# Patient Record
Sex: Female | Born: 1965 | Race: White | Hispanic: No | Marital: Married | Smoking: Never smoker
Health system: Southern US, Community
[De-identification: ages and names within clinical notes are randomized; demographics above are authoritative.]

## PROBLEM LIST (undated history)

## (undated) DIAGNOSIS — E785 Hyperlipidemia, unspecified: Secondary | ICD-10-CM

## (undated) DIAGNOSIS — E669 Obesity, unspecified: Secondary | ICD-10-CM

## (undated) DIAGNOSIS — I10 Essential (primary) hypertension: Secondary | ICD-10-CM

## (undated) DIAGNOSIS — G932 Benign intracranial hypertension: Secondary | ICD-10-CM

## (undated) DIAGNOSIS — J45909 Unspecified asthma, uncomplicated: Secondary | ICD-10-CM

## (undated) DIAGNOSIS — E119 Type 2 diabetes mellitus without complications: Secondary | ICD-10-CM

## (undated) DIAGNOSIS — E781 Pure hyperglyceridemia: Secondary | ICD-10-CM

## (undated) DIAGNOSIS — I82409 Acute embolism and thrombosis of unspecified deep veins of unspecified lower extremity: Secondary | ICD-10-CM

## (undated) DIAGNOSIS — E039 Hypothyroidism, unspecified: Secondary | ICD-10-CM

## (undated) DIAGNOSIS — I2510 Atherosclerotic heart disease of native coronary artery without angina pectoris: Secondary | ICD-10-CM

## (undated) HISTORY — DX: Hypothyroidism, unspecified: E03.9

## (undated) HISTORY — DX: Type 2 diabetes mellitus without complications: E11.9

## (undated) HISTORY — PX: GALLBLADDER SURGERY: SHX652

## (undated) HISTORY — DX: Essential (primary) hypertension: I10

## (undated) HISTORY — DX: Atherosclerotic heart disease of native coronary artery without angina pectoris: I25.10

## (undated) HISTORY — DX: Unspecified asthma, uncomplicated: J45.909

## (undated) HISTORY — DX: Pure hyperglyceridemia: E78.1

## (undated) HISTORY — DX: Obesity, unspecified: E66.9

## (undated) HISTORY — DX: Acute embolism and thrombosis of unspecified deep veins of unspecified lower extremity: I82.409

## (undated) HISTORY — DX: Benign intracranial hypertension: G93.2

## (undated) HISTORY — PX: OTHER SURGICAL HISTORY: SHX169

## (undated) HISTORY — DX: Hyperlipidemia, unspecified: E78.5

---

## 2006-09-12 ENCOUNTER — Ambulatory Visit (HOSPITAL_COMMUNITY): Admission: RE | Admit: 2006-09-12 | Discharge: 2006-09-12 | Payer: Self-pay | Admitting: *Deleted

## 2008-09-09 ENCOUNTER — Encounter: Payer: Self-pay | Admitting: Cardiovascular Disease

## 2009-01-24 ENCOUNTER — Encounter: Payer: Self-pay | Admitting: Cardiovascular Disease

## 2009-01-24 LAB — CONVERTED CEMR LAB
Free T4: 10.4 ng/dL
T3 Total by RIA: 29.2 nmol/L

## 2009-04-22 ENCOUNTER — Encounter: Payer: Self-pay | Admitting: Cardiovascular Disease

## 2009-04-22 LAB — CONVERTED CEMR LAB
Alkaline Phosphatase: 77 units/L
BUN: 14 mg/dL
Cholesterol: 138 mg/dL
Creatinine, Ser: 0.68 mg/dL
HDL: 39 mg/dL
Total Bilirubin: 0.5 mg/dL
Total Protein: 6.8 g/dL
Triglyceride fasting, serum: 227 mg/dL

## 2009-12-28 ENCOUNTER — Encounter: Payer: Self-pay | Admitting: Cardiovascular Disease

## 2010-01-12 ENCOUNTER — Ambulatory Visit: Payer: Self-pay | Admitting: Cardiovascular Disease

## 2010-01-12 DIAGNOSIS — R0789 Other chest pain: Secondary | ICD-10-CM

## 2010-01-12 DIAGNOSIS — R7309 Other abnormal glucose: Secondary | ICD-10-CM | POA: Insufficient documentation

## 2010-01-12 DIAGNOSIS — E785 Hyperlipidemia, unspecified: Secondary | ICD-10-CM

## 2010-01-12 DIAGNOSIS — E039 Hypothyroidism, unspecified: Secondary | ICD-10-CM | POA: Insufficient documentation

## 2010-01-13 ENCOUNTER — Encounter: Payer: Self-pay | Admitting: Cardiovascular Disease

## 2010-01-13 LAB — CONVERTED CEMR LAB
ALT: 50 units/L
BUN: 12 mg/dL
CO2: 25.9 meq/L
Chloride: 101 meq/L
HDL: 27 mg/dL
TSH: 0.515 microintl units/mL
Total Bilirubin: 0.5 mg/dL
Total Protein: 7.4 g/dL
Triglyceride fasting, serum: 265 mg/dL

## 2010-01-16 ENCOUNTER — Encounter: Payer: Self-pay | Admitting: Cardiovascular Disease

## 2010-01-17 DIAGNOSIS — I2510 Atherosclerotic heart disease of native coronary artery without angina pectoris: Secondary | ICD-10-CM | POA: Insufficient documentation

## 2010-01-17 DIAGNOSIS — Z8672 Personal history of thrombophlebitis: Secondary | ICD-10-CM | POA: Insufficient documentation

## 2010-01-17 DIAGNOSIS — E119 Type 2 diabetes mellitus without complications: Secondary | ICD-10-CM | POA: Insufficient documentation

## 2010-01-17 DIAGNOSIS — R0789 Other chest pain: Secondary | ICD-10-CM | POA: Insufficient documentation

## 2010-01-23 ENCOUNTER — Telehealth: Payer: Self-pay | Admitting: Cardiovascular Disease

## 2010-06-21 ENCOUNTER — Ambulatory Visit: Payer: Self-pay | Admitting: Cardiovascular Disease

## 2010-10-27 ENCOUNTER — Telehealth: Payer: Self-pay | Admitting: Cardiovascular Disease

## 2010-12-30 ENCOUNTER — Encounter: Payer: Self-pay | Admitting: *Deleted

## 2011-01-09 NOTE — Progress Notes (Signed)
Summary: [facility]  [facility]   Imported By: Harlon Flor 01/13/2010 11:22:16  _____________________________________________________________________  External Attachment:    Type:   Image     Comment:   External Document

## 2011-01-09 NOTE — Letter (Signed)
Summary: Medical Record Release  Medical Record Release   Imported By: Harlon Flor 02/09/2010 09:08:09  _____________________________________________________________________  External Attachment:    Type:   Image     Comment:   External Document

## 2011-01-09 NOTE — Assessment & Plan Note (Signed)
Summary: 6 MONTH   Visit Type:  Follow-up Referring Provider:  Mariah Milling Primary Provider:  Dorann Lodge, MD  CC:  No cardiac complaints. Denies chest pain or shortness of breath..  History of Present Illness: 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  She reports that she has had no episodes of tachycardia palpitations on her beta blocker. She feels well. She has been doing the Southeast/Mediterranean diet and has lost over 20 pounds. Her husband is also trying to lose weight. She has been spending most of her summer at the beach and has no complaints of shortness of breath or chest discomfort. She has had relief of her chest discomfort with her ranexa.   Cholesterol is well controlled on no statin medications.  Current Medications (verified): 1)  Bystolic 10 Mg Tabs (Nebivolol Hcl) .Marland Kitchen.. 1 By Mouth Once Daily or As Directed 2)  Synthroid 125 Mcg Tabs (Levothyroxine Sodium) .Marland Kitchen.. 1 By Mouth Once Daily 3)  Ranexa 500 Mg Xr12h-Tab (Ranolazine) .... 2 Tabs By Mouth Twice A Day or As Instructed By Dr. Dian Situ)  Daily Multi  Tabs (Multiple Vitamins-Minerals) .Marland Kitchen.. 1 By Mouth Once Daily 5)  Aspirin 81 Mg Tbec (Aspirin) .... Take One Tablet By Mouth Daily 6)  Metformin Hcl 500 Mg Tabs (Metformin Hcl) .... 2 By Mouth Once Daily 7)  Skelaxin 800 Mg Tabs (Metaxalone) .... As Needed 8)  Fexofenadine Hcl 180 Mg Tabs (Fexofenadine Hcl) .Marland Kitchen.. 1 Once Daily 9)  Furosemide 20 Mg Tabs (Furosemide) .Marland Kitchen.. 1-2 Tablets Once Daily 10)  Vitamin E 400 Unit Caps (Vitamin E) .Marland Kitchen.. 1 Once Daily 11)  B Complex Vitamins  Caps (B Complex Vitamins) .Marland Kitchen.. 1 Once Daily 12)  Sm Omega-3 Fish Oil 1200 Mg Caps (Omega-3 Fatty Acids) .Marland Kitchen.. 1 Once Daily 13)  Vitamin  D3  Allergies (verified): 1)  ! Pcn  Past History:  Past Medical History: Last updated: 01/16/2010 Hyperlipidemia Hypertension Hypothyroidism border line diabetic Left leg DVT Nom critical CAD  Past Surgical History: Last updated: 21-Jan-2010 3 c-sections gall bladder lumbar paritineal shunt ablasion  Family History: Last updated: 01/21/10 Father:deceased 61; MI; HTn Mother: living 13:  Brothers: 37 and 71: healthy, older brother some health problems  Social History: Last updated: Jan 21, 2010 Full Time Married  Tobacco Use - No.  Alcohol Use - no Regular Exercise - no Drug Use - no  Risk Factors: Alcohol Use: 0 (01-21-10) Caffeine Use: 1 cups a day (01/21/2010) Exercise: no (21-Jan-2010)  Risk Factors: Smoking Status: never (January 21, 2010)  Review of Systems  The patient denies fever, weight loss, weight gain, vision loss, decreased hearing, hoarseness, chest pain, syncope, dyspnea on exertion, peripheral edema, prolonged cough, abdominal pain, incontinence, muscle weakness, depression, and enlarged lymph nodes.    Vital Signs:  Patient profile:   45 year old female Height:      61 inches Weight:      242 pounds BMI:     45.89 Pulse rate:   79 / minute BP sitting:   134 / 91  (left arm) Cuff size:   large  Vitals Entered By: Bishop Dublin, CMA (June 21, 2010 10:19 AM)  Physical Exam  General:  Well developed, well nourished, in no acute distress. Head:  normocephalic and atraumatic Neck:  Neck supple, no JVD. No masses, thyromegaly or abnormal cervical nodes. Lungs:  Clear bilaterally to auscultation and percussion. Heart:  Non-displaced PMI, chest non-tender; regular rate and rhythm, S1, S2 without murmurs, rubs or gallops. Carotid upstroke normal, no bruit. Pedals normal pulses. No edema, no varicosities. Abdomen:  Bowel sounds positive; abdomen soft and non-tender without masses. Obese Msk:  Back normal, normal gait. Muscle strength and tone  normal. Pulses:  pulses normal in all 4 extremities Extremities:  No clubbing or cyanosis. Neurologic:  Alert and oriented x 3. Skin:  Intact without lesions or rashes. Psych:  Normal affect.   Impression & Recommendations:  Problem # 1:  CHEST DISCOMFORT (ICD-786.59) no recent episodes of chest discomfort or palpitations on a low-dose beta blocker and Ranexa. We will continue these medications.  Her updated medication list for this problem includes:    Bystolic 10 Mg Tabs (Nebivolol hcl) .Marland Kitchen... 1 by mouth once daily or as directed    Ranexa 500 Mg Xr12h-tab (Ranolazine) .Marland Kitchen... 2 tabs by mouth twice a day or as instructed by dr.    Aspirin 81 Mg Tbec (Aspirin) .Marland Kitchen... Take one tablet by mouth daily  Problem # 2:  OBESITY-MORBID (>100') (ICD-278.01) I'm very excited that her diet is working and she has lost at least 20 pounds. Her encouraged her to increase her walking.  Patient Instructions: 1)  Your physician recommends that you continue on your current medications as directed. Please refer to the Current Medication list given to you today. 2)  Your physician wants you to follow-up in: 1 year.   You will receive a reminder letter in the mail two months in advance. If you don't receive a letter, please call our office to schedule the follow-up appointment. Prescriptions: BYSTOLIC 10 MG TABS (NEBIVOLOL HCL) 1 by mouth once daily or as directed  #28 x 0   Entered by:   Sherri Rad, RN, BSN   Authorized by:   Dossie Arbour MD   Signed by:   Dossie Arbour MD on 06/21/2010   Method used:   Samples Given   RxID:   [id number]

## 2011-01-09 NOTE — Progress Notes (Signed)
Summary: [facility]  [facility]   Imported By: Harlon Flor 01/13/2010 11:21:52  _____________________________________________________________________  External Attachment:    Type:   Image     Comment:   External Document

## 2011-01-09 NOTE — Progress Notes (Signed)
Summary: husband called about labs  Phone Note Call from Patient   Caller: Spouse Summary of Call: pts husband called today to f/u on labs that pt had drawn at [facility].  no results have been received.  pts husband upset that this has not been followed up on- instructed him that information for faxing the labs were included on the orders and that nothing had been rec'd.  contacted [facility] to get labs. will f/u with pt once labs rec'd.  Initial call taken by: Charlena Cross, RN, BSN,  January 23, 2010 3:24 PM

## 2011-01-09 NOTE — Miscellaneous (Signed)
Summary: lab update  Clinical Lists Changes  Observations: Added new observation of ALBUMIN: 3.7 g/dL (16/09/9603 5:40) Added new observation of PROTEIN, TOT: 7.4 g/dL (98/10/9146 8:29) Added new observation of CALCIUM: 9.1 mg/dL (56/21/3086 5:78) Added new observation of ALK PHOS: 91 units/L (01/13/2010 8:48) Added new observation of BILI TOTAL: 0.5 mg/dL (46/96/2952 8:41) Added new observation of SGPT (ALT): 50 units/L (01/13/2010 8:48) Added new observation of SGOT (AST): 27 units/L (01/13/2010 8:48) Added new observation of CO2 PLSM/SER: 25.9 meq/L (01/13/2010 8:48) Added new observation of CL SERUM: 101 meq/L (01/13/2010 8:48) Added new observation of K SERUM: 3.9 meq/L (01/13/2010 8:48) Added new observation of NA: 138 meq/L (01/13/2010 8:48) Added new observation of CREATININE: 0.7 mg/dL (32/44/0102 7:25) Added new observation of BUN: 12 mg/dL (36/64/4034 7:42) Added new observation of BG RANDOM: 109 mg/dL (59/56/3875 6:43) Added new observation of TSH: .515 microintl units/mL (01/13/2010 8:47) Added new observation of TRIGLYCERIDE: 265 mg/dL (32/95/1884 1:66) Added new observation of HDL: 27 mg/dL (06/08/1600 0:93) Added new observation of LDL: 69 mg/dL (23/55/7322 0:25) Added new observation of CHOLESTEROL: 149 mg/dL (42/70/6237 6:28)      -  Date:  01/13/2010    Cholesterol: 149    LDL: 69    HDL: 27    Triglycerides: 315    TSH: .515    BG Random: 109    BUN: 12    Creatinine: 0.7    Sodium: 138    Potassium: 3.9    Chloride: 101    CO2 Total: 25.9    SGOT (AST): 27    SGPT (ALT): 50    T. Bilirubin: 0.5    Alk Phos: 91    Calcium: 9.1    Total Protein: 7.4    Albumin: 3.7

## 2011-01-09 NOTE — Progress Notes (Signed)
Summary: Surgical Clearance  Phone Note Call from Patient Call back at Home Phone [phone]   Caller: Husband Call For: [facility] Summary of Call: Needs surgical clearance note for tonsilectomy-Dr. Juel Burrow phone #[phone] Initial call taken by: Harlon Flor,  October 27, 2010 11:20 AM  Follow-up for Phone Call        No cardiac testing needed. She can proceed with the surgery. Follow-up by: Dr Julien Nordmann, October 29, 2010  Additional Follow-up for Phone Call Additional follow up Details #1::        Called office, faxing phone note with clearance to [phone], OK per Dr Mariah Milling to proceed with surgery, no further cardiac testing needed.  Additional Follow-up by: Cloyde Reams RN,  October 30, 2010 8:32 AM

## 2011-01-09 NOTE — Progress Notes (Signed)
Summary: PHI  PHI   Imported By: Harlon Flor 01/13/2010 11:22:31  _____________________________________________________________________  External Attachment:    Type:   Image     Comment:   External Document

## 2011-01-09 NOTE — Assessment & Plan Note (Signed)
Summary: NP6/AMD   Visit Type:  New Patient Referring Provider:  Mariah Bauer Primary Provider:  Dorann Lodge, MD  CC:  no complaints.  History of Present Illness: 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  Ms. Palen states that overall she has been doing well. She continues to battle with her obesity and her weight is up 6 pounds or more from her last visit. She has had episodes of tachycardia palpitations and has had to take an extra half dose of her bystolic for a total of 10 mg. Typically she takes 5 mg a day. she continues to have mild left lower stomach swelling secondary to her history of DVT. She is not working out at [employer] though previously she used to work out at [employer]. She continues to get some episodes of chest discomfort particularly if she does not take her ranexa.  Preventive Screening-Counseling & Management  Alcohol-Tobacco     Alcohol drinks/day: 0     Smoking Status: never  Caffeine-Diet-Exercise     Caffeine use/day: 1 cups a day     Does Patient Exercise: no      Drug Use:  no.    Current Problems (verified): 1)  Chest Pain, Atypical, Hx of  (ICD-V15.89)  Current Medications (verified): 1)  Bystolic 10 Mg Tabs (Nebivolol Hcl) .Marland Kitchen.. 1 By Mouth Once Daily or As Directed 2)  Synthroid 125 Mcg Tabs (Levothyroxine Sodium) .Marland Kitchen.. 1 By Mouth Once Daily 3)  Renexa 500 .Marland Kitchen.. 1 By Mouth Two Times A Day or As Directed 4)  Daily Multi  Tabs (Multiple Vitamins-Minerals) .Marland Kitchen.. 1 By Mouth Once Daily 5)  Aspirin 81 Mg Tbec (Aspirin) .... Take One Tablet By Mouth Daily 6)  Metformin Hcl 500 Mg Tabs (Metformin Hcl) .... 2 By Mouth Once Daily  Allergies (verified): 1)  ! Pcn  Past History:  Past Medical  History: Hyperlipidemia Hypertension Hypothyroidism border line diabetic  Past Surgical History: 3 c-sections gall bladder lumbar paritineal shunt ablasion  Family History: Father:deceased 20; MI; HTn Mother: living 1:  Brothers: 37 and 44: healthy, older brother some health problems  Social History: Full Time Married  Tobacco Use - No.  Alcohol Use - no Regular Exercise - no Drug Use - no Alcohol drinks/day:  0 Smoking Status:  never Caffeine use/day:  1 cups a day Does Patient Exercise:  no Drug Use:  no  Review of Systems       The patient complains of chest pain.  The patient denies anorexia, fever, weight loss, weight gain, vision loss, decreased hearing, hoarseness, syncope, dyspnea on exertion, peripheral edema, prolonged cough, headaches, hemoptysis, abdominal pain, melena, hematochezia, severe indigestion/heartburn, hematuria, incontinence, genital sores, muscle weakness, suspicious skin lesions, transient blindness, difficulty walking, depression, unusual weight change, abnormal bleeding, enlarged lymph nodes, angioedema, breast masses, and testicular masses.         + palpitations, tachycardia  Vital Signs:  Patient profile:   45 year old female Height:      61 inches Weight:      268 pounds BMI:     50.82 Pulse rate:   70 / minute Pulse rhythm:   regular BP sitting:   118 / 70  (right arm) Cuff size:   large  Vitals Entered By: Mercer Pod (January 12, 2010 10:36 AM)  Physical Exam  General:  no apparent distress, obese woman with normal respirations. H. ENT exam is benign, oropharynx is clear, neck is supple with no JVP or carotid bruits. Heart sounds are regular with normal S1 and S2 with no murmurs appreciated, lungs are clear to auscultation with no wheezes Rales, normal exam is notable for severe obesity though otherwise benign. No significant lower extremity edema, pulses are equal and symmetrical in her upper and lower extremities.  Neurological exam is grossly nonfocal. Skin is warm and dry.   New Orders:     1)  T-Comprehensive Metabolic Panel [id number])     2)  T-Lipid Profile ([id number])     3)  T-TSH ([id number])     4)  T-Hgb A1C ([id number])   Impression & Recommendations:  Problem # 1:  CHEST PAIN, ATYPICAL, HX OF (ICD-V15.89) cardiac catheter in 2007 that showed noncritical CAD. She has been maintained on her next a with significantly  improved episodes of chest pain.we will continue her on this medication at 500 mg p.o. b.i.d.  Problem # 2:  HYPERLIPIDEMIA (ICD-272.4) we have given her a lab slip to have her cholesterol checked in the next week and will followup with her on the results. As she is diabetic or prediabetic, her LDL should be 100 or less. She is currently not on a cholesterol medication. Orders: T-Comprehensive Metabolic Panel [id number]) T-Lipid Profile ([id number])  Problem # 3:  PRE-DIABETES (ICD-790.29) we will check a hemoglobin A1c with her lab check this week. We have strongly encouraged her to lose weight and she is well above her ideal body weight. She and her husband state that they are going to be joined a gym and start working out. She's currently on metformin 1000 mg at night Orders: T-Hgb A1C ([id number])  Problem # 4:  OBESITY-MORBID (>100') (ICD-278.01) recommended to Decrease her p.o. intake, increase her exercise in an effort to lose weight. We did not address with her that a gastric bypass surgery or LAP-BAND surgery might be an option we can address this with her later.  Other Orders: T-TSH [id number])  Patient Instructions: 1)  Your physician recommends that you schedule a follow-up appointment in: 6 MONTHS 2)  Your physician recommends that you return for a FASTING lipid profile: at [facility] (lipids, cmet, a1c, tsh) 3)  Your physician recommends that you continue on your current medications as directed. Please refer to the Current Medication  list given to you today. Prescriptions: RANEXA 500 MG XR12H-TAB (RANOLAZINE) 2 tabs by mouth twice a day or as instructed by Dr.  Lawana Chambers x 6   Entered by:   Charlena Cross, RN, BSN   Authorized by:   Dossie Arbour MD   Signed by:   Charlena Cross, RN, BSN on 01/12/2010   Method used:   Electronically to        [facility]* (retail)       [address]       [place], [place]  [address]       Ph: [phone]       Fax: [phone]   RxID:   [id number] BYSTOLIC 10 MG TABS (NEBIVOLOL HCL) 1 by mouth once daily or as directed  #30 x 6   Entered by:   Charlena Cross, RN, BSN   Authorized by:   Dossie Arbour MD   Signed by:   Charlena Cross, RN, BSN on 01/12/2010  Method used:   Electronically to        [facility]* (retail)       [address]       [place], [place]  [address]       Ph: [phone]       Fax: [phone]   RxID:   [id number]

## 2011-06-27 ENCOUNTER — Encounter: Payer: Self-pay | Admitting: Cardiovascular Disease

## 2011-07-12 ENCOUNTER — Encounter: Payer: Self-pay | Admitting: Cardiovascular Disease

## 2011-07-12 ENCOUNTER — Ambulatory Visit (INDEPENDENT_AMBULATORY_CARE_PROVIDER_SITE_OTHER): Payer: BC Managed Care – PPO | Admitting: Cardiovascular Disease

## 2011-07-12 DIAGNOSIS — R609 Edema, unspecified: Secondary | ICD-10-CM | POA: Insufficient documentation

## 2011-07-12 DIAGNOSIS — Z8672 Personal history of thrombophlebitis: Secondary | ICD-10-CM

## 2011-07-12 DIAGNOSIS — Z9189 Other specified personal risk factors, not elsewhere classified: Secondary | ICD-10-CM

## 2011-07-12 DIAGNOSIS — E119 Type 2 diabetes mellitus without complications: Secondary | ICD-10-CM

## 2011-07-12 DIAGNOSIS — E785 Hyperlipidemia, unspecified: Secondary | ICD-10-CM

## 2011-07-12 DIAGNOSIS — I2510 Atherosclerotic heart disease of native coronary artery without angina pectoris: Secondary | ICD-10-CM

## 2011-07-12 MED ORDER — NEBIVOLOL HCL 10 MG PO TABS: 10.0000 mg | ORAL_TABLET | Freq: Every day | ORAL | Status: DC

## 2011-07-12 MED ORDER — RANOLAZINE ER 500 MG PO TB12: 1000.0000 mg | ORAL_TABLET | Freq: Two times a day (BID) | ORAL | Status: DC

## 2011-07-12 NOTE — Assessment & Plan Note (Signed)
We did not discuss her weight with her as her husband and son were in the room. We'll need to discuss this with her on her next visit.

## 2011-07-12 NOTE — Assessment & Plan Note (Signed)
She is at risk of further DVTs. We have instructed her on the leg lifts and hip flexing movement while she sits at her desk. She has trace bilateral edema and we have suggested leg elevation and TED hose. She can also try Ace wraps at the end of the day.

## 2011-07-12 NOTE — Patient Instructions (Addendum)
You are doing well. No medication changes were made. Try leg elevation, ace wrap, increase diuretic for leg swelling. Please call us if you have new issues that need to be addressed before your next appt.  We will call you for a follow up Appt. In 12 months

## 2011-07-12 NOTE — Assessment & Plan Note (Signed)
Cholesterol in the past has been excellent. She Would like to come off her fish oil for now as it is contributing to GI upset. We have suggested she try flaxseed oil for elevated triglycerides.

## 2011-07-12 NOTE — Assessment & Plan Note (Signed)
Currently with no symptoms of angina. No further workup at this time. Continue current medication regimen. 

## 2011-07-12 NOTE — Assessment & Plan Note (Signed)
We have encouraged continued exercise, careful diet management in an effort to lose weight. 

## 2011-07-12 NOTE — Assessment & Plan Note (Signed)
Bilateral edema that is trace pitting. We have suggested she take her Lasix on a regular basis. Her edema certainly could be secondary to venous insufficiency though she should try Lasix with fluid restriction and low salt intake. Given her weight, she will be prone to fluid retention and possibly sleep apnea and diastolic dysfunction.

## 2011-07-12 NOTE — Progress Notes (Signed)
Patient ID: Emma Bauer, female    DOB: 01-08-1966, 45 y.o.   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   She reports that she has been having worsening edema of both legs over the past several weeks. She has been spending long periods at her desk as well as standing. She has not been taking her Lasix on a regular basis. She does drink a lot of fluids.   She also reports some tightness in her chest. She did have a very stressful period when her child was sick with Salmonella. She increased her ranexa to 1000 mg b.i.d. And also increase her bystolic to 10 mg. This seemed to make her symptoms mildly better though she continues to have very mild symptoms. She does have some heartburn type symptoms as well and complains about the fish oil and a fishy taste.  Her weight has been an issue and continues to trend upwards. EKG today shows normal sinus rhythm with rate 74 beats per minute with no significant ST or T wave changes. Left axis deviation    Outpatient Encounter Prescriptions as of 07/12/2011  Medication Sig Dispense Refill  . aspirin 81 MG EC tablet Take 81 mg by mouth daily.        Marland Kitchen b complex vitamins capsule Take 1 capsule by mouth daily.        . fexofenadine (ALLEGRA) 180 MG tablet Take 180 mg by mouth daily.        . furosemide (LASIX) 20 MG tablet Take 20-40 mg by mouth daily.       Marland Kitchen levothyroxine (SYNTHROID, LEVOTHROID) 125 MCG tablet Take 125 mcg by mouth daily.        . metaxalone (SKELAXIN) 800 MG tablet Take 800 mg by mouth as needed.        . metFORMIN (GLUCOPHAGE) 500 MG tablet Take 1,000 mg by mouth daily.        . Multiple Vitamin (MULTIVITAMIN) tablet Take 1 tablet  by mouth daily.        . nebivolol (BYSTOLIC) 10 MG tablet Take 1 tablet (10 mg total) by mouth daily.  30 tablet  11  . Omega-3 Fatty Acids (SM OMEGA-3 FISH OIL) 1200 MG CAPS Take 1 capsule by mouth daily.        . potassium chloride (KLOR-CON) 10 MEQ CR tablet Take 10 mEq by mouth daily.        . ranolazine (RANEXA) 500 MG 12 hr tablet Take 2 tablets (1,000 mg total) by mouth 2 (two) times daily. Take two tablets twice a day.  120 tablet  11  . vitamin E 400 UNIT capsule Take 400 Units by mouth daily.           Review of Systems  Constitutional: Negative.   HENT: Negative.   Eyes: Negative.   Respiratory: Negative.   Cardiovascular: Negative.   Gastrointestinal: Negative.   Musculoskeletal: Negative.   Skin: Negative.   Neurological: Negative.   Hematological: Negative.   Psychiatric/Behavioral: Negative.   All other systems reviewed and are negative.    BP 120/80  Pulse 74  Ht 5\' 1"  (1.549 m)  Wt 266 lb (120.657 kg)  BMI 50.26 kg/m2  Physical  Exam  Nursing note and vitals reviewed. Constitutional: She is oriented to person, place, and time. She appears well-developed and well-nourished.       Morbidly obese  HENT:  Head: Normocephalic.  Nose: Nose normal.  Mouth/Throat: Oropharynx is clear and moist.  Eyes: Conjunctivae are normal. Pupils are equal, round, and reactive to light.  Neck: Normal range of motion. Neck supple. No JVD present.  Cardiovascular: Normal rate, regular rhythm, S1 normal, S2 normal, normal heart sounds and intact distal pulses.  Exam reveals no gallop and no friction rub.   No murmur heard.      Trace pitting edema to the mid shins bilateral  Pulmonary/Chest: Effort normal and breath sounds normal. No respiratory distress. She has no wheezes. She has no rales. She exhibits no tenderness.  Abdominal: Soft. Bowel sounds are normal. She exhibits no distension. There is no tenderness.  Musculoskeletal: Normal range of motion. She exhibits edema. She  exhibits no tenderness.  Lymphadenopathy:    She has no cervical adenopathy.  Neurological: She is alert and oriented to person, place, and time. Coordination normal.  Skin: Skin is warm and dry. No rash noted. No erythema.  Psychiatric: She has a normal mood and affect. Her behavior is normal. Judgment and thought content normal.         Assessment and Plan

## 2011-07-12 NOTE — Assessment & Plan Note (Signed)
She does have an atypical type chest sensation in the middle of her mediastinum. Possibly secondary to stress or GERD. No further workup at this time. We have suggested she increase her proton pump inhibitor. Take BID

## 2012-07-28 ENCOUNTER — Other Ambulatory Visit: Payer: Self-pay | Admitting: Cardiovascular Disease

## 2012-07-28 NOTE — Telephone Encounter (Signed)
Refilled Ranexa and scheduled 1 yr f/u appointment.

## 2012-08-07 ENCOUNTER — Other Ambulatory Visit: Payer: Self-pay | Admitting: Cardiovascular Disease

## 2012-08-07 NOTE — Telephone Encounter (Signed)
Refilled Bystolic

## 2012-08-19 ENCOUNTER — Ambulatory Visit (INDEPENDENT_AMBULATORY_CARE_PROVIDER_SITE_OTHER): Payer: BC Managed Care – PPO | Admitting: Cardiovascular Disease

## 2012-08-19 ENCOUNTER — Encounter: Payer: Self-pay | Admitting: Cardiovascular Disease

## 2012-08-19 VITALS — BP 128/70 | HR 74 | Ht 61.0 in | Wt 266.5 lb

## 2012-08-19 DIAGNOSIS — E785 Hyperlipidemia, unspecified: Secondary | ICD-10-CM

## 2012-08-19 DIAGNOSIS — R609 Edema, unspecified: Secondary | ICD-10-CM

## 2012-08-19 DIAGNOSIS — I2510 Atherosclerotic heart disease of native coronary artery without angina pectoris: Secondary | ICD-10-CM

## 2012-08-19 DIAGNOSIS — R002 Palpitations: Secondary | ICD-10-CM

## 2012-08-19 DIAGNOSIS — R000 Tachycardia, unspecified: Secondary | ICD-10-CM | POA: Insufficient documentation

## 2012-08-19 NOTE — Assessment & Plan Note (Signed)
Etiology of her tachycardia is uncertain. It has improved with higher dose ranexa at night and did not respond to higher dose beta blocker. Symptoms have resolved. I suggested if she starts to have symptoms again, we could do a Holter monitor.

## 2012-08-19 NOTE — Assessment & Plan Note (Signed)
Trace edema. We have suggested she continue on diuretic daily

## 2012-08-19 NOTE — Patient Instructions (Addendum)
You are doing well. No medication changes were made.  Please call us if you have new issues that need to be addressed before your next appt.  Your physician wants you to follow-up in: 6 months.  You will receive a reminder letter in the mail two months in advance. If you don't receive a letter, please call our office to schedule the follow-up appointment.   

## 2012-08-19 NOTE — Assessment & Plan Note (Signed)
We have encouraged continued exercise, careful diet management in an effort to lose weight. 

## 2012-08-19 NOTE — Progress Notes (Signed)
Patient ID: Emma Bauer, female    DOB: 08-Feb-1966, 46 y.o.   MRN: [id number]  HPI Comments: Ms. 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  She reports that her edema has been stable with a diuretic. She has had recent episodes of tachycardia at nighttime that wake her from sleep at 1 in the morning sometimes later lasting for at least 2 hours. She tried extra beta blocker but this did not help her symptoms. She took extra ranexa at nighttime and this seems to have helped her symptoms. She's not had a tachycardia for at least 2 weeks. She was having headaches in the daytime that got worse into the night. She took Zomig periodically and this seemed to help.   No significant chest pain or shortness of breath. Her weight has been an issue and continues to trend upwards.  EKG today shows normal sinus rhythm with rate 74 beats per minute with no significant ST or T wave changes. Left axis deviation    Outpatient Encounter Prescriptions as of 08/19/2012  Medication Sig Dispense Refill  . aspirin 81 MG EC tablet Take 81 mg by mouth daily.        Marland Kitchen b complex vitamins capsule Take 1 capsule by mouth daily.        Marland Kitchen BYSTOLIC 10 MG tablet TAKE 1 TABLET BY MOUTH EVERY DAY  30 tablet  10  . citalopram (CELEXA) 10 MG tablet Take 10 mg by mouth daily.      . fexofenadine (ALLEGRA) 180 MG tablet Take 180 mg by mouth daily.        . furosemide (LASIX) 20 MG tablet Take 20-40 mg by mouth daily.       Marland Kitchen levothyroxine (SYNTHROID, LEVOTHROID) 112 MCG tablet Take 112 mcg by mouth daily.      . metaxalone (SKELAXIN) 800 MG tablet Take 800 mg by mouth as needed.        . metFORMIN (GLUCOPHAGE) 500 MG tablet Take 1,000 mg by mouth daily.         . Multiple Vitamin (MULTIVITAMIN) tablet Take 1 tablet by mouth daily.        Marland Kitchen omeprazole (PRILOSEC) 40 MG capsule Take 40 mg by mouth daily.      Marland Kitchen RANEXA 500 MG 12 hr tablet TAKE 2 TABLETS BY MOUTH TWICE DAILY  120 tablet  5  . saxagliptin HCl (ONGLYZA) 5 MG TABS tablet Take 5 mg by mouth daily.        Review of Systems  Constitutional: Negative.   HENT: Negative.   Eyes: Negative.   Respiratory: Negative.   Cardiovascular: Positive for palpitations.       Tachycardia  Gastrointestinal: Negative.   Musculoskeletal: Negative.   Skin: Negative.   Neurological: Negative.   Hematological: Negative.   Psychiatric/Behavioral: Negative.   All other systems reviewed and are negative.    BP 128/70  Pulse 74  Ht 5\' 1"  (1.549 m)  Wt 266 lb 8 oz (120.884 kg)  BMI 50.35 kg/m2  Physical Exam  Nursing note and vitals reviewed. Constitutional: She is oriented to person, place, and time. She appears well-developed and well-nourished.       Morbidly obese  HENT:  Head: Normocephalic.  Nose: Nose  normal.  Mouth/Throat: Oropharynx is clear and moist.  Eyes: Conjunctivae are normal. Pupils are equal, round, and reactive to light.  Neck: Normal range of motion. Neck supple. No JVD present.  Cardiovascular: Normal rate, regular rhythm, S1 normal, S2 normal, normal heart sounds and intact distal pulses.  Exam reveals no gallop and no friction rub.   No murmur heard.      Trace pitting edema to the mid shins bilateral  Pulmonary/Chest: Effort normal and breath sounds normal. No respiratory distress. She has no wheezes. She has no rales. She exhibits no tenderness.  Abdominal: Soft. Bowel sounds are normal. She exhibits no distension. There is no tenderness.  Musculoskeletal: Normal range of motion. She exhibits no edema and no tenderness.  Lymphadenopathy:    She has no cervical adenopathy.  Neurological: She is alert and oriented to person, place, and time. Coordination normal.  Skin:  Skin is warm and dry. No rash noted. No erythema.  Psychiatric: She has a normal mood and affect. Her behavior is normal. Judgment and thought content normal.         Assessment and Plan

## 2012-08-19 NOTE — Assessment & Plan Note (Signed)
Currently with no symptoms of angina. No further workup at this time. Continue current medication regimen. 

## 2012-08-19 NOTE — Assessment & Plan Note (Signed)
We have suggested she send lab work from Dr. Leonor Liv when it is done later this year for our review

## 2013-03-09 ENCOUNTER — Ambulatory Visit (INDEPENDENT_AMBULATORY_CARE_PROVIDER_SITE_OTHER): Payer: Managed Care, Other (non HMO) | Admitting: Cardiovascular Disease

## 2013-03-09 ENCOUNTER — Encounter: Payer: Self-pay | Admitting: Cardiovascular Disease

## 2013-03-09 VITALS — BP 142/82 | HR 82 | Ht 60.0 in | Wt 269.0 lb

## 2013-03-09 DIAGNOSIS — E119 Type 2 diabetes mellitus without complications: Secondary | ICD-10-CM

## 2013-03-09 DIAGNOSIS — Z9189 Other specified personal risk factors, not elsewhere classified: Secondary | ICD-10-CM

## 2013-03-09 DIAGNOSIS — R000 Tachycardia, unspecified: Secondary | ICD-10-CM

## 2013-03-09 DIAGNOSIS — I2510 Atherosclerotic heart disease of native coronary artery without angina pectoris: Secondary | ICD-10-CM

## 2013-03-09 DIAGNOSIS — E785 Hyperlipidemia, unspecified: Secondary | ICD-10-CM

## 2013-03-09 NOTE — Assessment & Plan Note (Signed)
Tachycardia is not a significant issue on today's visit. No further workup at this time

## 2013-03-09 NOTE — Assessment & Plan Note (Signed)
Encouraged weight loss, diet and exercise

## 2013-03-09 NOTE — Patient Instructions (Addendum)
You are doing well. No medication changes were made.  Please call us if you have new issues that need to be addressed before your next appt.  Your physician wants you to follow-up in: 12 months.  You will receive a reminder letter in the mail two months in advance. If you don't receive a letter, please call our office to schedule the follow-up appointment. 

## 2013-03-09 NOTE — Progress Notes (Signed)
Patient ID: Emma Bauer, female    DOB: Oct 12, 1966, 47 y.o.   MRN: [id number]  HPI Comments: 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  She reports that overall she has been doing well. She was diagnosed with reactive airway disease. She has been on a prednisone taper, started on Singulair. 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 Other allergens such as smoke and perfumes will cause her to be more short of breath with wheezing. No significant problems with palpitations or tachycardia. No recent chest pain. Edema has been stable. No significant chest pain or shortness of breath. Her weight has been stable.  EKG today shows normal sinus rhythm with rate 82 beats per minute with no significant ST or T wave changes. Left axis deviation    Outpatient Encounter Prescriptions as of 03/09/2013  Medication Sig Dispense Refill  . aspirin 81 MG EC tablet Take 81 mg by mouth daily.        Marland Kitchen b complex vitamins capsule Take 1 capsule by mouth daily.        Marland Kitchen buPROPion (WELLBUTRIN XL) 150 MG 24 hr tablet Take 150 mg by mouth daily.       Marland Kitchen BYSTOLIC 10 MG tablet TAKE 1 TABLET BY MOUTH EVERY DAY  30 tablet  10  . fexofenadine (ALLEGRA) 180 MG tablet Take 180 mg by mouth daily.        . furosemide (LASIX) 20 MG tablet Take 20-40 mg by mouth daily as needed.       Marland Kitchen levothyroxine (SYNTHROID, LEVOTHROID) 100 MCG tablet Take 100 mcg by mouth daily.      . metaxalone (SKELAXIN) 800 MG tablet Take 800 mg by mouth as needed.        . metFORMIN (GLUCOPHAGE) 500 MG tablet Take 1,000 mg by mouth daily.        . montelukast (SINGULAIR) 10 MG tablet Take 10 mg by mouth at bedtime.      . moxifloxacin (AVELOX) 400 MG tablet Take 400 mg by  mouth daily.      . Multiple Vitamin (MULTIVITAMIN) tablet Take 1 tablet by mouth daily.        Marland Kitchen omeprazole (PRILOSEC) 40 MG capsule Take 40 mg by mouth daily.      . predniSONE (STERAPRED UNI-PAK) 10 MG tablet Take by mouth daily.       Marland Kitchen RANEXA 500 MG 12 hr tablet TAKE 2 TABLETS BY MOUTH TWICE DAILY  120 tablet  5  . saxagliptin HCl (ONGLYZA) 5 MG TABS tablet Take 5 mg by mouth daily.         Review of Systems  Constitutional: Negative.   HENT: Negative.   Eyes: Negative.   Respiratory: Negative.   Cardiovascular:       Tachycardia  Gastrointestinal: Negative.   Musculoskeletal: Negative.   Skin: Negative.   Neurological: Negative.   Psychiatric/Behavioral: Negative.   All other systems reviewed and are negative.    BP 142/82  Pulse 82  Ht 5' (1.524 m)  Wt 269 lb (122.018 kg)  BMI 52.54 kg/m2  Physical Exam  Nursing note and vitals reviewed. Constitutional: She is oriented to person, place, and time. She appears well-developed and well-nourished.  Morbidly obese  HENT:  Head: Normocephalic.  Nose: Nose normal.  Mouth/Throat: Oropharynx is clear and moist.  Eyes: Conjunctivae are normal. Pupils are equal, round, and reactive to light.  Neck: Normal range of motion. Neck supple. No JVD present.  Cardiovascular: Normal rate, regular rhythm, S1 normal, S2 normal, normal heart sounds and intact distal pulses.  Exam reveals no gallop and no friction rub.   No murmur heard. Trace pitting edema to the mid shins bilateral  Pulmonary/Chest: Effort normal and breath sounds normal. No respiratory distress. She has no wheezes. She has no rales. She exhibits no tenderness.  Abdominal: Soft. Bowel sounds are normal. She exhibits no distension. There is no tenderness.  Musculoskeletal: Normal range of motion. She exhibits no edema and no tenderness.  Lymphadenopathy:    She has no cervical adenopathy.  Neurological: She is alert and oriented to person, place, and time.  Coordination normal.  Skin: Skin is warm and dry. No rash noted. No erythema.  Psychiatric: She has a normal mood and affect. Her behavior is normal. Judgment and thought content normal.    Assessment and Plan

## 2013-03-09 NOTE — Assessment & Plan Note (Signed)
We have encouraged continued exercise, careful diet management in an effort to lose weight. 

## 2013-03-09 NOTE — Assessment & Plan Note (Signed)
Currently with no symptoms of angina. No further workup at this time. Continue current medication regimen. 

## 2013-03-09 NOTE — Assessment & Plan Note (Signed)
No recent episodes of chest pain

## 2013-08-17 ENCOUNTER — Other Ambulatory Visit: Payer: Self-pay | Admitting: Cardiovascular Disease

## 2013-08-17 NOTE — Telephone Encounter (Signed)
Refilled Bystolic sent to [facility].

## 2013-08-24 ENCOUNTER — Other Ambulatory Visit: Payer: Self-pay | Admitting: Cardiovascular Disease

## 2013-08-24 NOTE — Telephone Encounter (Signed)
Refilled Ranexa sent to [facility] pharmacy.

## 2014-02-22 ENCOUNTER — Encounter: Payer: Self-pay | Admitting: Cardiovascular Disease

## 2014-02-22 ENCOUNTER — Ambulatory Visit (INDEPENDENT_AMBULATORY_CARE_PROVIDER_SITE_OTHER): Payer: Managed Care, Other (non HMO) | Admitting: Cardiovascular Disease

## 2014-02-22 VITALS — BP 134/80 | HR 92 | Ht 60.0 in | Wt 282.2 lb

## 2014-02-22 DIAGNOSIS — I2510 Atherosclerotic heart disease of native coronary artery without angina pectoris: Secondary | ICD-10-CM

## 2014-02-22 DIAGNOSIS — E119 Type 2 diabetes mellitus without complications: Secondary | ICD-10-CM

## 2014-02-22 DIAGNOSIS — R7309 Other abnormal glucose: Secondary | ICD-10-CM

## 2014-02-22 DIAGNOSIS — R609 Edema, unspecified: Secondary | ICD-10-CM

## 2014-02-22 DIAGNOSIS — R000 Tachycardia, unspecified: Secondary | ICD-10-CM

## 2014-02-22 DIAGNOSIS — E785 Hyperlipidemia, unspecified: Secondary | ICD-10-CM

## 2014-02-22 MED ORDER — FUROSEMIDE 20 MG PO TABS: 20.0000 mg | ORAL_TABLET | Freq: Two times a day (BID) | ORAL | Status: DC | PRN

## 2014-02-22 MED ORDER — RANOLAZINE ER 1000 MG PO TB12
1000.0000 mg | ORAL_TABLET | Freq: Two times a day (BID) | ORAL | Status: DC
Start: 2014-02-22 — End: 2015-08-05

## 2014-02-22 NOTE — Assessment & Plan Note (Signed)
She denies any recent symptoms concerning for tachycardia

## 2014-02-22 NOTE — Progress Notes (Signed)
Patient ID: Emma Bauer, female    DOB: 03-14-1966, 48 y.o.   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  She reports that overall she has been doing well. Previously diagnosed with  reactive airway disease. Last year required a prednisone taper, started on Singulair. 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 Other allergens such as smoke and perfumes will cause her to be more short of breath with wheezing. No significant problems with palpitations or tachycardia. No recent chest pain. Edema has been worse recently over the past week. Uncertain if diet has changed No significant chest pain or shortness of breath. Her weight has been stable, continues to be elevated Not participating in a regular exercise program  EKG today shows normal sinus rhythm with rate 92 beats per minute with no significant ST or T wave changes.     Outpatient Encounter Prescriptions as of 02/22/2014  Medication Sig  . aspirin 81 MG EC tablet Take 81 mg by mouth daily.    Marland Kitchen b complex vitamins capsule Take 1 capsule by mouth daily.    Marland Kitchen BYSTOLIC 10 MG tablet TAKE 1 TABLET BY MOUTH EVERY DAY  . fexofenadine (ALLEGRA) 180 MG tablet Take 180 mg by mouth daily.    . furosemide (LASIX) 20 MG tablet Take 20-40 mg by mouth daily as needed.   Marland Kitchen levothyroxine (SYNTHROID, LEVOTHROID) 100 MCG tablet Take 100 mcg by mouth daily.  . metaxalone (SKELAXIN) 800 MG tablet Take 800 mg by mouth as needed.    . metFORMIN (GLUCOPHAGE) 500 MG tablet Take 1,000 mg by mouth daily.    . mometasone-formoterol (DULERA) 200-5 MCG/ACT AERO Inhale 2 puffs into the lungs daily.  . montelukast (SINGULAIR) 10 MG tablet Take 10 mg by mouth  at bedtime.  . Multiple Vitamin (MULTIVITAMIN) tablet Take 1 tablet by mouth daily.    Marland Kitchen omeprazole (PRILOSEC) 40 MG capsule Take 40 mg by mouth daily.  Marland Kitchen RANEXA 500 MG 12 hr tablet TAKE 2 TABLETS BY MOUTH TWICE DAILY  . saxagliptin HCl (ONGLYZA) 5 MG TABS tablet Take 5 mg by mouth daily.     Review of Systems  Constitutional: Negative.   HENT: Negative.   Eyes: Negative.   Respiratory: Negative.   Cardiovascular: Negative.        Tachycardia  Gastrointestinal: Negative.   Endocrine: Negative.   Musculoskeletal: Negative.   Skin: Negative.   Allergic/Immunologic: Negative.   Neurological: Negative.   Hematological: Negative.   Psychiatric/Behavioral: Negative.   All other systems reviewed and are negative.    BP 134/80  Pulse 92  Ht 5' (1.524 m)  Wt 282 lb 4 oz (128.028 kg)  BMI 55.12 kg/m2  Physical Exam  Nursing note and vitals reviewed. Constitutional: She is oriented to person, place, and time. She appears well-developed and well-nourished.  Morbidly obese  HENT:  Head: Normocephalic.  Nose: Nose normal.  Mouth/Throat: Oropharynx is clear and moist.  Eyes: Conjunctivae are normal. Pupils are equal, round, and reactive to light.  Neck: Normal range of motion. Neck supple. No JVD present.  Cardiovascular: Normal rate, regular rhythm, S1 normal, S2 normal, normal heart sounds and intact distal pulses.  Exam reveals no gallop and no friction  rub.   No murmur heard. Trace pitting edema to the mid shins bilateral  Pulmonary/Chest: Effort normal and breath sounds normal. No respiratory distress. She has no wheezes. She has no rales. She exhibits no tenderness.  Abdominal: Soft. Bowel sounds are normal. She exhibits no distension. There is no tenderness.  Musculoskeletal: Normal range of motion. She exhibits no edema and no tenderness.  Lymphadenopathy:    She has no cervical adenopathy.  Neurological: She is alert and oriented to person, place, and time. Coordination  normal.  Skin: Skin is warm and dry. No rash noted. No erythema.  Psychiatric: She has a normal mood and affect. Her behavior is normal. Judgment and thought content normal.    Assessment and Plan

## 2014-02-22 NOTE — Assessment & Plan Note (Signed)
Mild pitting edema recently per the patient's. Recommended Lasix as needed with potassium

## 2014-02-22 NOTE — Patient Instructions (Signed)
You are doing well. No medication changes were made.  Please call us if you have new issues that need to be addressed before your next appt.  Your physician wants you to follow-up in: 12 months.  You will receive a reminder letter in the mail two months in advance. If you don't receive a letter, please call our office to schedule the follow-up appointment. 

## 2014-02-22 NOTE — Assessment & Plan Note (Signed)
Cholesterol well controlled, no medication needed. Total cholesterol 136, LDL 54

## 2014-02-22 NOTE — Assessment & Plan Note (Deleted)
She reports hemoglobin A1c 6.5

## 2014-02-22 NOTE — Assessment & Plan Note (Signed)
Hemoglobin A1c 6.5. We have recommended weight loss and exercise

## 2014-02-22 NOTE — Assessment & Plan Note (Signed)
We have encouraged continued exercise, careful diet management in an effort to lose weight. 

## 2014-02-23 ENCOUNTER — Telehealth: Payer: Self-pay

## 2014-02-23 ENCOUNTER — Telehealth: Payer: Self-pay | Admitting: *Deleted

## 2014-02-23 MED ORDER — POTASSIUM CHLORIDE ER 10 MEQ PO TBCR: 10.0000 meq | EXTENDED_RELEASE_TABLET | Freq: Every day | ORAL | Status: DC | PRN

## 2014-02-23 NOTE — Telephone Encounter (Signed)
Potassium wasn't called into pharmacy, please call it in.

## 2014-02-23 NOTE — Telephone Encounter (Signed)
Refill sent for potassium 10 meq one tablet daily prn with the fluid pill (furosemide).

## 2014-02-23 NOTE — Telephone Encounter (Signed)
Notified patient per Dr. Mariah MillingGollan the patient needs Potassium 10 meq take one tablet daily prn when taking the fluid pill (furosemide).

## 2014-03-25 ENCOUNTER — Other Ambulatory Visit: Payer: Self-pay | Admitting: Cardiovascular Disease

## 2014-05-17 ENCOUNTER — Telehealth: Payer: Self-pay | Admitting: *Deleted

## 2014-05-17 NOTE — Telephone Encounter (Signed)
Patient's  Spouse called and the 1000 mg of Renexa is too big for his wife to swallow and needs a smaller mg to be called in. Please call when this has been done.

## 2014-05-18 NOTE — Telephone Encounter (Signed)
LVM 6/9 

## 2014-05-25 NOTE — Telephone Encounter (Signed)
Left message for pt to call back  °

## 2014-05-26 NOTE — Telephone Encounter (Signed)
Left message for pt to call back  °

## 2014-09-04 ENCOUNTER — Other Ambulatory Visit: Payer: Self-pay | Admitting: Cardiovascular Disease

## 2015-01-06 ENCOUNTER — Ambulatory Visit: Payer: Self-pay | Admitting: Cardiovascular Disease

## 2015-01-06 ENCOUNTER — Telehealth: Payer: Self-pay | Admitting: Cardiovascular Disease

## 2015-01-06 ENCOUNTER — Encounter: Payer: Self-pay | Admitting: Physician Assistant

## 2015-01-06 ENCOUNTER — Ambulatory Visit (INDEPENDENT_AMBULATORY_CARE_PROVIDER_SITE_OTHER): Payer: Managed Care, Other (non HMO)

## 2015-01-06 ENCOUNTER — Ambulatory Visit (INDEPENDENT_AMBULATORY_CARE_PROVIDER_SITE_OTHER): Payer: Managed Care, Other (non HMO) | Admitting: Physician Assistant

## 2015-01-06 VITALS — BP 118/74 | HR 81 | Ht 60.0 in | Wt 252.0 lb

## 2015-01-06 VITALS — BP 111/70 | HR 84 | Ht 60.0 in | Wt 255.2 lb

## 2015-01-06 DIAGNOSIS — E781 Pure hyperglyceridemia: Secondary | ICD-10-CM

## 2015-01-06 DIAGNOSIS — J45909 Unspecified asthma, uncomplicated: Secondary | ICD-10-CM

## 2015-01-06 DIAGNOSIS — I2510 Atherosclerotic heart disease of native coronary artery without angina pectoris: Secondary | ICD-10-CM

## 2015-01-06 DIAGNOSIS — R079 Chest pain, unspecified: Secondary | ICD-10-CM

## 2015-01-06 DIAGNOSIS — Z8249 Family history of ischemic heart disease and other diseases of the circulatory system: Secondary | ICD-10-CM

## 2015-01-06 DIAGNOSIS — E785 Hyperlipidemia, unspecified: Secondary | ICD-10-CM

## 2015-01-06 DIAGNOSIS — R0789 Other chest pain: Secondary | ICD-10-CM

## 2015-01-06 LAB — BASIC METABOLIC PANEL
Anion Gap: 10 (ref 7–16)
BUN: 13 mg/dL (ref 7–18)
CHLORIDE: 107 mmol/L (ref 98–107)
CO2: 24 mmol/L (ref 21–32)
CREATININE: 0.75 mg/dL (ref 0.60–1.30)
Calcium, Total: 8.8 mg/dL (ref 8.5–10.1)
EGFR (African American): >60
EGFR (Non-African Amer.): >60
Glucose: 100 mg/dL — ABNORMAL HIGH (ref 65–99)
Osmolality: 281 (ref 275–301)
POTASSIUM: 4.1 mmol/L (ref 3.5–5.1)
SODIUM: 141 mmol/L (ref 136–145)

## 2015-01-06 LAB — LIPID PANEL
Cholesterol: 155 mg/dL (ref 0–200)
HDL: 28 mg/dL — AB (ref 40–60)
Ldl Cholesterol, Calc: 51 mg/dL (ref 0–100)
Triglycerides: 379 mg/dL — ABNORMAL HIGH (ref 0–200)
VLDL CHOLESTEROL, CALC: 76 mg/dL — AB (ref 5–40)

## 2015-01-06 LAB — TROPONIN I: Troponin-I: <0.02 ng/mL

## 2015-01-06 MED ORDER — NITROGLYCERIN 0.4 MG SL SUBL: 0.4000 mg | SUBLINGUAL_TABLET | SUBLINGUAL | Status: AC | PRN

## 2015-01-06 NOTE — Progress Notes (Addendum)
1.) Reason for visit: Worsening chest pain x 1 week.  2.) Name of MD requesting visit: Dr. Mariah MillingGollan  3.) H&P: Pt has been doing well until a couple of weeks ago.  Reports that she was put on Glyxambi, which has brought her HgbA1c back to normal range, and pt has lost approximately 30 lbs since last ov.    4.) ROS related to problem: Pt reports worsening chest pain x 1 week.  She has taken nitro daily w/ no relief, though on review, this med expired in 2014.  She increased her Ranexa from 500 mg to 1000 mg x a week and a half w/ no relief.  She describes constant chest pressure in the center of chest that radiates to her right arm.  The pressure will worsen into pain and she becomes nauseous and clammy.  She is concerned, as she had these sx before and she needed a cath.    5.) Assessment and plan per MD:  EKG performed and [facility] to [facility] for stat troponin.  Troponin < 0.02.  Pt notified.  She is sched to see Dr. Mariah MillingGollan 01/10/15 @ 7:45 am, as her husband is concerned about etiology of chest pain. New rx for nitro sent in for pt to p/u today.  Eula Listenyan Dunn, PA had cancellation today at 1:15, so pt was put in that time slot for eval today.

## 2015-01-06 NOTE — Telephone Encounter (Signed)
°  Pt c/o of Chest Pain: STAT if CP now or developed within 24 hours  1. Are you having CP right now? Took nitro 745 and it kinda went away but it doesn't fully go away .  2. Are you experiencing any other symptoms (ex. SOB, nausea, vomiting, sweating)? Pain in right arm at times and pain in neck last night. And this was before you took nitro  3. How long have you been experiencing CP? consistently since last night, but since last night it has not gone away.   4. Is your CP continuous or coming and going? continuous   5. Have you taken Nitroglycerin? 1 at 745 am this morning  and 2 last night around 130-2am.   Pressure seemed worst when she tried to lay in the recliner. Chest pains, last night felt had pressure like normal but it hurt from center  ?

## 2015-01-06 NOTE — Patient Instructions (Addendum)
Your physician has requested that you have an echocardiogram. Echocardiography is a painless test that uses sound waves to create images of your heart. It provides your doctor with information about the size and shape of your heart and how well your heart's chambers and valves are working. This procedure takes approximately one hour. There are no restrictions for this procedure.  Your physician recommends that you schedule a follow-up appointment after your tests.  [facility] MYOVIEW  Your caregiver has ordered a Stress Test with nuclear imaging. The purpose of this test is to evaluate the blood supply to your heart muscle. This procedure is referred to as a "Non-Invasive Stress Test." This is because other than having an IV started in your vein, nothing is inserted or "invades" your body. Cardiac stress tests are done to find areas of poor blood flow to the heart by determining the extent of coronary artery disease (CAD). Some patients exercise on a treadmill, which naturally increases the blood flow to your heart, while others who are  unable to walk on a treadmill due to physical limitations have a pharmacologic/chemical stress agent called Lexiscan . This medicine will mimic walking on a treadmill by temporarily increasing your coronary blood flow.   Please note: these test may take anywhere between 2-4 hours to complete  PLEASE REPORT TO [facility]TR[facility]  THE VOLUNTEERS AT THE FIRST DESK WILL DIRECT YOU WHERE TO GO  Date of Procedure:_________Friday, January 29_________  Arrival Time for Procedure:______9:45 am_____________  Instructions regarding medication:   __X__ : Hold diabetes medication morning of procedure  PLEASE NOTIFY THE OFFICE AT LEAST 24 HOURS IN ADVANCE IF YOU ARE UNABLE TO KEEP YOUR APPOINTMENT.  [phone] AND  PLEASE NOTIFY NUCLEAR MEDICINE AT [facility] AT LEAST 24 HOURS IN ADVANCE IF YOU ARE UNABLE TO KEEP YOUR APPOINTMENT. [phone]  How to prepare for your  Myoview test:  1. Do not eat or drink after midnight 2. No caffeine for 24 hours prior to test 3. No smoking 24 hours prior to test. 4. Your medication may be taken with water.  If your doctor stopped a medication because of this test, do not take that medication. 5. Ladies, please do not wear dresses.  Skirts or pants are appropriate. Please wear a short sleeve shirt. 6. No perfume, cologne or lotion.

## 2015-01-06 NOTE — Patient Instructions (Signed)
Please proceed to the Medical Mall entrance of [facility] for stat troponin.  I will call your cell phone with those results.

## 2015-01-06 NOTE — Telephone Encounter (Signed)
Spoke w/ pt.  She reports worsening chest pain x 1 week.  Taking nitro daily, increased Ranexa from 500 mg to 1000 mg x week and half w/ no relief. Describes constant chest pressure in center of chest that radiates to arm. When worsens to pain, she is nauseous and clammy. Advised pt to have her husband drive her to office for EKG and stat troponin.  She is agreeable to this and will be here within the hour.

## 2015-01-06 NOTE — Progress Notes (Signed)
Patient Name: Laurana, Magistro May 24, 1966, MRN [id number]  Date of Encounter: 01/06/2015  Primary Care Provider:  Junius Roads, MD Primary Cardiologist:  Dr. Mariah Milling, MD  Chief Complaint  Patient presents with  . other    C/o chest pain. Meds reviewed verbally with pt.    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   In 2007 she presented to her PCP for chest pain that was occuring both at rest and with exertion. She was referred to Dr. Jenne Campus for further evaluation. She underwent cardiac cath 2/2 her symptoms that reportedly showed vasospasm, no significant CAD. She was started on antianginals. Over the years her medication regimen was titrated to Bystolic 10 mg and Ranexa 500 mg bid (may take 1000 mg bid if needed). She also has SL NTG prn. She has done quite well on this regimen. She has never been a smoker, does not drink alcohol, and does not use illicit drugs. Her father had CAD s/p MI x 3 (she is uncertain how old he was at his first MI. He unfortunately passed away 2/2 SCD/MI at age 67. She also has significant CAD in her maternal grandmother and both maternal and paternal aunts. Her last cholesterol on file indicates a TC of 136, LDL of 54, HDL of 32, and TG of 249. She is not currently on statin therapy for primary prevention as she states if another medication gets added one of her current medications must be discontinued. She is reasonably active at baseline.   She presented to the office this morning after calling and stating she had been experiencing continuous chest pain that will wax and wane in quality. Pain is not related  to rest or exertion and will usually only a couple of minutes before calming back down. If her pain lasts any longer than that she will take a SL NTG with aid in her symptoms at times. She has noted some associated headache, nausea, and clamminess at times, though none now. No increased dyspnea (has asthma at baseline), vomiting, presyncope, or syncope. She has gone up on her Ranexa to 1000 mg bid at times throughout the past 1 week and is now on this scheduled. She was advised to come in for a nurse visit this morning for an EKG and stat troponin. EKG showed NSR, 81 bpm, left axis deviation, low voltage QRS, poor R wave progression possibly 2/2 her asthma, no significant st/t changes. Her stat troponin was <0.02. She was scheduled for a follow up with Dr. Mariah Milling on 2/1 at 7:45 AM, but I had a cancellation so her appointment was changed to today.       Past Medical History  Diagnosis Date  . HLD (hyperlipidemia)   . HTN (hypertension)   . Hypothyroidism   . DM (diabetes mellitus)     borderline  . DVT (deep venous thrombosis)     L leg 2/2 long car trip  . Coronary artery disease, non-occlusive     a. 2007, non obstructive, symptoms felt to be 2/2 vasospasm   . Pseudotumor cerebri     lumbar to peritoneal shunt  . Obesity   .  Asthma     Past Surgical History  Procedure Laterality Date  . Cesarean section      x3   . Gallbladder surgery    . Lumbar paritineal shunt    . Ablasion      Social History:  The patient  reports that she has never smoked. She does not have any smokeless tobacco history on file. She reports that she does not drink alcohol or use illicit drugs.     Family History  Problem Relation Age of Onset  . CAD Father   . Heart attack Father     MI x 3 (age of first MI unknown, 2nd MI at age 51, age of 3rd MI 61-->which unfortunately caused his death)  . Heart failure Brother   . CAD Maternal Grandmother   . CAD Maternal Aunt   . CAD Paternal Aunt      Allergies  Allergen Reactions  . Penicillins    Home Medications:  Current Outpatient Prescriptions  Medication Sig Dispense Refill  . aspirin 81 MG EC tablet Take 81 mg by mouth daily.      Marland Kitchen. b complex vitamins capsule Take 1 capsule by mouth daily.      Marland Kitchen. BYSTOLIC 10 MG tablet TAKE 1 TABLET BY MOUTH EVERY DAY 30 tablet 6  . citalopram (CELEXA) 20 MG tablet Take 20 mg by mouth daily.    . Empagliflozin-Linagliptin 25-5 MG TABS Take 25 mg by mouth once.    . fexofenadine (ALLEGRA) 180 MG tablet Take 180 mg by mouth daily.      . furosemide (LASIX) 20 MG tablet Take 1 tablet (20 mg total) by mouth 2 (two) times daily as needed. 60 tablet 6  . levothyroxine (SYNTHROID, LEVOTHROID) 100 MCG tablet Take 100 mcg by mouth daily.    . metaxalone (SKELAXIN) 800 MG tablet Take 800 mg by mouth as needed.      . metFORMIN (GLUCOPHAGE) 500 MG tablet Take 1,000 mg by mouth daily.      . mometasone-formoterol (DULERA) 200-5 MCG/ACT AERO Inhale 2 puffs into the lungs daily.    . montelukast (SINGULAIR) 10 MG tablet Take 10 mg by mouth at bedtime.    . Multiple Vitamin (MULTIVITAMIN) tablet Take 1 tablet by mouth daily.      . nitroGLYCERIN (NITROSTAT) 0.4 MG SL tablet Place 1 tablet (0.4 mg total) under the tongue every 5 (five) minutes as needed for chest pain. 25 tablet 6  . omeprazole (PRILOSEC) 40 MG capsule Take 40 mg by mouth daily.    . potassium chloride (K-DUR) 10 MEQ tablet TAKE 1 TABLET BY MOUTH DAILY AS NEEDED 30 tablet 3  . pregabalin (LYRICA) 75 MG capsule Take 75 mg by mouth 2 (two) times daily.    . ranolazine (RANEXA) 1000 MG SR tablet Take 1 tablet (1,000 mg total) by mouth 2 (two) times daily. 60 tablet 11   No current facility-administered medications for this visit.     Weights: Wt Readings from Last 3 Encounters:  01/06/15 255 lb 4 oz (115.781 kg)  01/06/15 252 lb (114.306 kg)  02/22/14 282 lb 4 oz (128.028 kg)     Review of Systems:  As above.  All other systems  reviewed and are otherwise negative except as noted above.  Physical Exam:  Blood pressure 111/70, pulse 84, height 5' (1.524 m), weight 255 lb 4 oz (115.781 kg). Body mass index is 49.85 kg/(m^2). General: Pleasant, NAD Psych: Normal affect. Neuro: Alert and oriented X  3. Moves all extremities spontaneously. HEENT: Normal  Neck: Supple without bruits or JVD. Lungs:  Resp regular and unlabored, CTA. Heart: RRR no s3, s4, or murmurs. Symptoms not reproducible.  Abdomen: Soft, non-tender, non-distended, BS + x 4.  Extremities: No clubbing, cyanosis or edema.    Accessory Clinical Findings:  EKG - NSR, 81 bpm, left axis deviation, low voltage QRS, poor R wave progression possibly 2/2 her asthma, no significant st/t changes   Recent Labs: No results found for requested labs within last 365 days.    Lipid Panel    Component Value Date/Time   CHOL 149 01/13/2010   TRIG 265 01/13/2010   HDL 27 01/13/2010   LDLCALC 69 01/13/2010   LDLCALC 54 04/22/2009   TC 136, LDL 54, HDL 32, TG 249 on 12/1912  Assessment & Plan:  1. Chest pain with moderate risk of cardiac etiology: -Lexiscan Myoview 01/07/2015 to evaluate for high risk ischemia  -Echo to evaluate LV function, and pressures  -Offered cardiac for definitive evaluation, patient declined  -Continue Ranexa 1000 mg bid and Bystolic 10 mg daily -If the above studies are normal and her symptoms persist could add Imdur 30 mg daily and see how her symptoms progress vs cardiac cath if she chooses  -Continue aspirin 81 mg daily and SL NTG 0.4 mg prn  2. DM2: -Patient was recently started on Glyxambi -Already on metformin 1000 mg daily  -Check bmet and FLP   3. Hypertriglyceridemia: -Await most up to date FLP that I added on during clinic -Would consider starting statin for primary prevention   4. HTN: -Controlled -Continue Bystolic 10 mg daily -Lasix 20 mg prn (usually needs in the summer months 2/2 lower extremity edema -  none currently)   5. Asthma: -Continue current regimen of po medications and inhalers as directed by PCP  6. Studies pending: -Added on bmet and FLP to blood drawn this morning   Dispo: -Short term follow up after the above tests are complete    Eula Listen, PA-C [facility] [address] [place], [place] [address] [phone] [facility] 01/06/2015, 3:24 PM

## 2015-01-07 ENCOUNTER — Telehealth: Payer: Self-pay

## 2015-01-07 ENCOUNTER — Ambulatory Visit: Payer: Self-pay | Admitting: Cardiovascular Disease

## 2015-01-07 DIAGNOSIS — R079 Chest pain, unspecified: Secondary | ICD-10-CM

## 2015-01-07 NOTE — Telephone Encounter (Signed)
Pt spouse called, states pt has stress test this morning, states when she came out of the test this a.m., she had a "horrible headache", and had severe chest pain, and had to take a Nitro. Spouse asks if they should go ahead and set up a cath? States she has a "dull sensation" heaviness in the center of her chest. Please call and advise.

## 2015-01-07 NOTE — Telephone Encounter (Signed)
Spoke w/ pt's husband.  Advised him that I had spoke w/ Eula Listenyan Dunn, PA regarding pt's sx. Advised him that a HA is a normal side effect after a lexiscan and this should resolve. Advised him that Dr. Mariah MillingGollan will read her stress test and call to sched cath if he feels she needs this.  Advised him to call 911 or [facility] if pt's sx become emergent over the weekend.  He verbalizes understanding and will call back w/ any questions or concerns.

## 2015-01-10 ENCOUNTER — Ambulatory Visit: Payer: Managed Care, Other (non HMO) | Admitting: Cardiovascular Disease

## 2015-01-13 ENCOUNTER — Other Ambulatory Visit: Payer: Self-pay

## 2015-01-13 ENCOUNTER — Other Ambulatory Visit (INDEPENDENT_AMBULATORY_CARE_PROVIDER_SITE_OTHER): Payer: Managed Care, Other (non HMO)

## 2015-01-13 ENCOUNTER — Telehealth: Payer: Self-pay | Admitting: *Deleted

## 2015-01-13 DIAGNOSIS — R079 Chest pain, unspecified: Secondary | ICD-10-CM

## 2015-01-13 DIAGNOSIS — I2510 Atherosclerotic heart disease of native coronary artery without angina pectoris: Secondary | ICD-10-CM

## 2015-01-13 DIAGNOSIS — R0789 Other chest pain: Secondary | ICD-10-CM

## 2015-01-13 NOTE — Telephone Encounter (Signed)
error 

## 2015-01-14 ENCOUNTER — Telehealth: Payer: Self-pay | Admitting: *Deleted

## 2015-01-14 DIAGNOSIS — R079 Chest pain, unspecified: Secondary | ICD-10-CM

## 2015-01-14 DIAGNOSIS — Z01812 Encounter for preprocedural laboratory examination: Secondary | ICD-10-CM

## 2015-01-14 NOTE — Telephone Encounter (Signed)
Pt calling asking when is her Cath.  Please call patient.

## 2015-01-14 NOTE — Telephone Encounter (Signed)
"  Notes Recorded by Sondra Bargesyan M Dunn, PA-C on 01/14/2015 at 3:36 PM -Please schedule patient for cath with Dr. Mariah MillingGollan on a day that looks good next week ------  Notes Recorded by Fransico SettersNicole E Baucom, RN on 01/14/2015 at 8:13 AM Patient would like to go forward with cath  She is still having pain  Who would you like to do cath?  What is the time frame? ------  Notes Recorded by Fransico SettersNicole E Baucom, RN on 01/13/2015 at 5:11 PM LVM 2/4 ------  Notes Recorded by Sondra Bargesyan M Dunn, PA-C on 01/13/2015 at 1:23 PM Please call the patient. -Echo is normal -Please print out her stress test from 1/29 (this never made it to me) and have it scanned in. I reviewed it upon seeing this echo. -Nuc was overall low risk, however there was significant attenuation artifact (GI and breast) making it a difficult study to interpret.  -On corrected images there was no significant ischemia, though still significant artifact -No significant wall motion abnormality (same on echo) -No EKG changes concerning for ischemia  -Given the above findings if she is still having chest pain or still wants a cath we should/can set this up

## 2015-01-14 NOTE — Telephone Encounter (Signed)
Spoke w/ pt.  Advised her to come in on Monday for pre-cath labs and chest x-ray and we can set up a date & time for cath. She verbalizes understanding and is agreeable.

## 2015-01-14 NOTE — Telephone Encounter (Signed)
What day should I set this up?

## 2015-01-14 NOTE — Telephone Encounter (Signed)
See result note.  

## 2015-01-14 NOTE — Telephone Encounter (Signed)
Pt called stating that when we call her back please call on either, [phone] or [phone] She said she spoke to [place] and that these are the easiest way to get a hold of her.

## 2015-01-17 ENCOUNTER — Ambulatory Visit: Payer: Self-pay | Admitting: Cardiovascular Disease

## 2015-01-17 ENCOUNTER — Other Ambulatory Visit (INDEPENDENT_AMBULATORY_CARE_PROVIDER_SITE_OTHER): Payer: Managed Care, Other (non HMO)

## 2015-01-17 DIAGNOSIS — Z01812 Encounter for preprocedural laboratory examination: Secondary | ICD-10-CM

## 2015-01-17 DIAGNOSIS — R079 Chest pain, unspecified: Secondary | ICD-10-CM

## 2015-01-17 NOTE — Telephone Encounter (Signed)
Pt came in for labs, cxr & precath instructions. She reports increased episodes of chest pain, states that "it happened this weekend and it put me on my knees". Husband states that he is working from home in order to be near her in case something happens, as he is so worried about her.  They request an appt for cath as soon as possible. Advised them that I will call them w/ a date & time when Dr. Mariah MillingGollan is in the office to discuss.

## 2015-01-18 ENCOUNTER — Ambulatory Visit: Payer: Managed Care, Other (non HMO) | Admitting: Cardiovascular Disease

## 2015-01-18 ENCOUNTER — Telehealth: Payer: Self-pay

## 2015-01-18 LAB — BASIC METABOLIC PANEL
BUN/Creatinine Ratio: 16 (ref 9–23)
BUN: 12 mg/dL (ref 6–24)
CALCIUM: 9.3 mg/dL (ref 8.7–10.2)
CHLORIDE: 105 mmol/L (ref 97–108)
CO2: 21 mmol/L (ref 18–29)
Creatinine, Ser: 0.76 mg/dL (ref 0.57–1.00)
GFR calc non Af Amer: 92 mL/min/{1.73_m2} (ref >59)
GFR, EST AFRICAN AMERICAN: 106 mL/min/{1.73_m2} (ref >59)
GLUCOSE: 115 mg/dL — AB (ref 65–99)
POTASSIUM: 4.9 mmol/L (ref 3.5–5.2)
Sodium: 143 mmol/L (ref 134–144)

## 2015-01-18 LAB — CBC WITH DIFFERENTIAL/PLATELET
BASOS: 0 %
Basophils Absolute: 0 10*3/uL (ref 0.0–0.2)
EOS ABS: 0.1 10*3/uL (ref 0.0–0.4)
Eos: 1 %
HEMATOCRIT: 40.3 % (ref 34.0–46.6)
HEMOGLOBIN: 13.6 g/dL (ref 11.1–15.9)
Immature Grans (Abs): 0 10*3/uL (ref 0.0–0.1)
Immature Granulocytes: 0 %
Lymphocytes Absolute: 2.7 10*3/uL (ref 0.7–3.1)
Lymphs: 30 %
MCH: 28.4 pg (ref 26.6–33.0)
MCHC: 33.7 g/dL (ref 31.5–35.7)
MCV: 84 fL (ref 79–97)
MONOS ABS: 0.8 10*3/uL (ref 0.1–0.9)
Monocytes: 8 %
NEUTROS ABS: 5.3 10*3/uL (ref 1.4–7.0)
NEUTROS PCT: 61 %
Platelets: 241 10*3/uL (ref 150–379)
RBC: 4.79 x10E6/uL (ref 3.77–5.28)
RDW: 15.7 % — ABNORMAL HIGH (ref 12.3–15.4)
WBC: 8.9 10*3/uL (ref 3.4–10.8)

## 2015-01-18 LAB — PROTIME-INR
INR: 0.9 (ref 0.8–1.2)
Prothrombin Time: 9.7 s (ref 9.1–12.0)

## 2015-01-18 NOTE — Telephone Encounter (Signed)
Dr. Mariah MillingGollan is aware and will review his calendar when out of clinic.

## 2015-01-18 NOTE — Telephone Encounter (Signed)
Spoke w/ pt.  Advised her that Dr. Mariah MillingGollan has reviewed his schedule for possible dates: 2/11 or 2/16 (1st case) or 2/18 @ 12:30. Advised her that scheduling is closed, so I will call first thing in the am and call her w/ date and time for her cath.  She is appreciative, though she expresses disappointment that this cannot be done sooner.

## 2015-01-18 NOTE — Telephone Encounter (Signed)
Please advise for the patient. Thanks!

## 2015-01-18 NOTE — Telephone Encounter (Signed)
Pt husband called, states they are waiting to schedule a cath, states he would like someone to call asap

## 2015-01-19 NOTE — Telephone Encounter (Signed)
Pt called asking when her cath will be sched for.  Advised her that Dr. Mariah MillingGollan and I are in communication, but a date has not been finalized, as 2/11 & 2/18 are unavailable and Dr. Kirke CorinArida is not here on 2/16. Advised her that I will call her as soon as I have a date and time for her cath.

## 2015-01-19 NOTE — Telephone Encounter (Signed)
Spoke w/ pt.  Advised her that cath is sched for 01/27/15 @ 1:30. She verbalizes understanding [facility] 12:30.

## 2015-01-20 ENCOUNTER — Ambulatory Visit: Payer: Managed Care, Other (non HMO) | Admitting: Cardiovascular Disease

## 2015-01-26 ENCOUNTER — Telehealth: Payer: Self-pay | Admitting: *Deleted

## 2015-01-26 NOTE — Telephone Encounter (Signed)
Spoke w/ pt's husband.  Advised him that pt does not need to bring an order w/ her in the am and to have pt hold lasix & metformin the am.  Advised him to refer the instruction sheet given to pt and call back w/ any questions or concerns.

## 2015-01-26 NOTE — Telephone Encounter (Signed)
Please call patient's husband. He has some questions regarding her heart cath.  1. No order to take to registration 2. Does she take her medication in the morning?

## 2015-01-27 ENCOUNTER — Other Ambulatory Visit: Payer: Self-pay | Admitting: Cardiovascular Disease

## 2015-01-27 DIAGNOSIS — I209 Angina pectoris, unspecified: Secondary | ICD-10-CM

## 2015-01-27 HISTORY — PX: CARDIAC CATHETERIZATION: SHX172

## 2015-01-27 MED ORDER — ISOSORBIDE MONONITRATE ER 30 MG PO TB24: 30.0000 mg | ORAL_TABLET | Freq: Every day | ORAL | Status: DC

## 2015-01-28 ENCOUNTER — Encounter: Payer: Self-pay | Admitting: Cardiovascular Disease

## 2015-02-03 ENCOUNTER — Ambulatory Visit (INDEPENDENT_AMBULATORY_CARE_PROVIDER_SITE_OTHER): Payer: Managed Care, Other (non HMO) | Admitting: Cardiovascular Disease

## 2015-02-03 ENCOUNTER — Encounter: Payer: Self-pay | Admitting: Cardiovascular Disease

## 2015-02-03 VITALS — BP 122/78 | HR 80 | Ht 60.0 in | Wt 253.5 lb

## 2015-02-03 DIAGNOSIS — I2510 Atherosclerotic heart disease of native coronary artery without angina pectoris: Secondary | ICD-10-CM

## 2015-02-03 DIAGNOSIS — Z9889 Other specified postprocedural states: Secondary | ICD-10-CM

## 2015-02-03 DIAGNOSIS — R000 Tachycardia, unspecified: Secondary | ICD-10-CM

## 2015-02-03 DIAGNOSIS — R609 Edema, unspecified: Secondary | ICD-10-CM

## 2015-02-03 DIAGNOSIS — E785 Hyperlipidemia, unspecified: Secondary | ICD-10-CM

## 2015-02-03 NOTE — Patient Instructions (Signed)
You are doing well. Please restart bystolic one a day for palpitations  Please call us if you have new issues that need to be addressed before your next appt.  Your physician wants you to follow-up in: 6 months.  You will receive a reminder letter in the mail two months in advance. If you don't receive a letter, please call our office to schedule the follow-up appointment.

## 2015-02-03 NOTE — Progress Notes (Signed)
Patient ID: Emma Bauer, female    DOB: Dec 09, 1966, 49 y.o.   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  She reported recent chest pain symptoms. Had a stress test that showed no ischemia. Chest pain symptoms persisted worse with exertion concerning for unstable angina. She had cardiac catheterization 01/27/2015. Catheterization showed no significant coronary disease of the main vessels, significant small vessel disease, ejection fraction greater than 55% She was started on isosorbide mononitrate one half pill. She is taking 15 mg daily with no symptoms apart from mild headache. She reports feeling much better She does report having palpitations at night. Beta blocker was held when isosorbide was started Minimal leg edema, worse after she has been on her feet Her weight has been stable, continues to be elevated Not participating in a regular exercise program  EKG on today's visit shows normal sinus rhythm with rate 80 bpm, left axis deviation.  Allergies  Allergen Reactions  . Penicillins     Outpatient Encounter Prescriptions as of 02/03/2015  Medication Sig  . aspirin 81 MG EC tablet Take 81 mg by mouth daily.    Marland Kitchen. b complex vitamins capsule Take 1 capsule by mouth daily.    Marland Kitchen. BYSTOLIC 10 MG tablet TAKE 1 TABLET BY MOUTH EVERY DAY  . citalopram (CELEXA) 20 MG tablet Take 20 mg by mouth daily.  . Empagliflozin-Linagliptin 25-5 MG TABS Take 25 mg by mouth once.  . fexofenadine (ALLEGRA) 180 MG tablet Take 180 mg by mouth daily.    . furosemide (LASIX) 20 MG tablet Take 1 tablet (20 mg total) by mouth 2 (two) times daily as needed.  .  isosorbide mononitrate (IMDUR) 30 MG 24 hr tablet Take 1 tablet (30 mg total) by mouth daily. (Patient taking differently: Take 15 mg by mouth daily. )  . levothyroxine (SYNTHROID, LEVOTHROID) 100 MCG tablet Take 100 mcg by mouth daily.  . metaxalone (SKELAXIN) 800 MG tablet Take 800 mg by mouth as needed.    . metFORMIN (GLUCOPHAGE) 500 MG tablet Take 1,000 mg by mouth daily.    . mometasone-formoterol (DULERA) 200-5 MCG/ACT AERO Inhale 2 puffs into the lungs daily.  . montelukast (SINGULAIR) 10 MG tablet Take 10 mg by mouth at bedtime.  . Multiple Vitamin (MULTIVITAMIN) tablet Take 1 tablet by mouth daily.    . nitroGLYCERIN (NITROSTAT) 0.4 MG SL tablet Place 1 tablet (0.4 mg total) under the tongue every 5 (five) minutes as needed for chest pain.  Marland Kitchen. omeprazole (PRILOSEC) 40 MG capsule Take 40 mg by mouth daily.  . potassium chloride (K-DUR) 10 MEQ tablet TAKE 1 TABLET BY MOUTH DAILY AS NEEDED  . pregabalin (LYRICA) 75 MG capsule Take 75 mg by mouth 2 (two) times daily.  . ranolazine (RANEXA) 1000 MG SR tablet Take 1 tablet (1,000 mg total) by mouth 2 (two) times daily.  . [DISCONTINUED] RANEXA 500 MG 12 hr tablet     Past Medical History  Diagnosis Date  . HLD (hyperlipidemia)   . HTN (hypertension)   . Hypothyroidism   . DM (diabetes mellitus)     borderline  . DVT (deep venous thrombosis)     L leg 2/2  long car trip  . Coronary artery disease, non-occlusive     a. 2007, non obstructive, symptoms felt to be 2/2 vasospasm   . Pseudotumor cerebri     lumbar to peritoneal shunt  . Obesity   . Asthma   . Hypertriglyceridemia     Past Surgical History  Procedure Laterality Date  . Cesarean section      x3   . Gallbladder surgery    . Lumbar paritineal shunt    . Ablasion    . Cardiac catheterization  01/27/2015    [facility]    Social History  reports that she has never smoked. She does not have any smokeless tobacco history on file. She reports that she does not drink alcohol or  use illicit drugs.  Family History family history includes CAD in her father, maternal aunt, maternal grandmother, and paternal aunt; Heart attack in her father; Heart failure in her brother.   Review of Systems  Constitutional: Negative.   Respiratory: Negative.   Cardiovascular: Negative.        Tachycardia  Gastrointestinal: Negative.   Musculoskeletal: Negative.   Skin: Negative.   Neurological: Negative.   Hematological: Negative.   Psychiatric/Behavioral: Negative.   All other systems reviewed and are negative.   BP 122/78 mmHg  Pulse 80  Ht 5' (1.524 m)  Wt 253 lb 8 oz (114.987 kg)  BMI 49.51 kg/m2  Physical Exam  Constitutional: She is oriented to person, place, and time. She appears well-developed and well-nourished.  Morbidly obese  HENT:  Head: Normocephalic.  Nose: Nose normal.  Mouth/Throat: Oropharynx is clear and moist.  Eyes: Conjunctivae are normal. Pupils are equal, round, and reactive to light.  Neck: Normal range of motion. Neck supple. No JVD present.  Cardiovascular: Normal rate, regular rhythm, S1 normal, S2 normal, normal heart sounds and intact distal pulses.  Exam reveals no gallop and no friction rub.   No murmur heard. Trace pitting edema to the mid shins bilateral  Pulmonary/Chest: Effort normal and breath sounds normal. No respiratory distress. She has no wheezes. She has no rales. She exhibits no tenderness.  Abdominal: Soft. Bowel sounds are normal. She exhibits no distension. There is no tenderness.  Musculoskeletal: Normal range of motion. She exhibits no edema or tenderness.  Lymphadenopathy:    She has no cervical adenopathy.  Neurological: She is alert and oriented to person, place, and time. Coordination normal.  Skin: Skin is warm and dry. No rash noted. No erythema.  Psychiatric: She has a normal mood and affect. Her behavior is normal. Judgment and thought content normal.    Assessment and Plan  Nursing note and vitals  reviewed.

## 2015-02-03 NOTE — Assessment & Plan Note (Signed)
She reports having tachycardia since the beta blocker was held. We will restart bystolic 5 mg daily

## 2015-02-03 NOTE — Assessment & Plan Note (Signed)
Lower extremity edema likely from venous insufficiency. Recommended compression hose, leg elevation If symptoms get worse, could use low dose diuretics for possible diastolic CHF.

## 2015-02-03 NOTE — Assessment & Plan Note (Signed)
Significant small vessel disease. For her chest pain symptoms will continue isosorbide mononitrate one half pill For worsening symptoms could increase the dose or use isosorbide dinitrate as needed

## 2015-02-03 NOTE — Assessment & Plan Note (Signed)
We have encouraged continued exercise, careful diet management in an effort to lose weight. 

## 2015-02-03 NOTE — Assessment & Plan Note (Signed)
Cholesterol well controlled, no medication needed. Total cholesterol 136, LDL 54

## 2015-02-23 ENCOUNTER — Other Ambulatory Visit: Payer: Self-pay | Admitting: Cardiovascular Disease

## 2015-03-28 ENCOUNTER — Other Ambulatory Visit: Payer: Self-pay | Admitting: Cardiovascular Disease

## 2015-06-21 ENCOUNTER — Other Ambulatory Visit: Payer: Self-pay | Admitting: Cardiovascular Disease

## 2015-07-18 ENCOUNTER — Other Ambulatory Visit: Payer: Managed Care, Other (non HMO)

## 2015-08-05 ENCOUNTER — Ambulatory Visit (INDEPENDENT_AMBULATORY_CARE_PROVIDER_SITE_OTHER): Payer: Managed Care, Other (non HMO) | Admitting: Cardiovascular Disease

## 2015-08-05 ENCOUNTER — Encounter: Payer: Self-pay | Admitting: Cardiovascular Disease

## 2015-08-05 VITALS — BP 100/64 | HR 80 | Ht 60.0 in | Wt 261.0 lb

## 2015-08-05 DIAGNOSIS — R609 Edema, unspecified: Secondary | ICD-10-CM | POA: Diagnosis not present

## 2015-08-05 DIAGNOSIS — I2510 Atherosclerotic heart disease of native coronary artery without angina pectoris: Secondary | ICD-10-CM | POA: Diagnosis not present

## 2015-08-05 DIAGNOSIS — E119 Type 2 diabetes mellitus without complications: Secondary | ICD-10-CM

## 2015-08-05 DIAGNOSIS — R000 Tachycardia, unspecified: Secondary | ICD-10-CM

## 2015-08-05 NOTE — Assessment & Plan Note (Signed)
Encouraged a weight loss program, exercise

## 2015-08-05 NOTE — Assessment & Plan Note (Signed)
Minimal edema. Likely chronic venous insufficiency exacerbated by long car trips, plane rides. Recommended compression hose for symptoms as needed

## 2015-08-05 NOTE — Assessment & Plan Note (Signed)
We have encouraged continued exercise, careful diet management in an effort to lose weight. 

## 2015-08-05 NOTE — Progress Notes (Signed)
Patient ID: Emma Bauer, female    DOB: [birth year], 49 y.o.   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 She is tolerating isosorbide mononitrate 15 mg daily. Continues on Ranexa 1000 g twice a day She is active, no regular exercise program. Continues to battle with her weight Minimal leg edema, sometimes after long car trip Tolerating low-dose beta blocker for palpitations  EKG on today's visit shows normal sinus rhythm with rate 80 bpm, no significant ST or T-wave changes. Left axis deviation him a poor R-wave progression to the anterior precordial leads   Allergies  Allergen Reactions  . Penicillins     Outpatient Encounter Prescriptions as of 08/05/2015  Medication Sig  . aspirin 81 MG EC tablet Take 81 mg by mouth daily.    Marland Kitchen b complex vitamins capsule Take 1 capsule by mouth daily.    Marland Kitchen BYSTOLIC 10 MG tablet TAKE 1 TABLET BY MOUTH EVERY DAY (Patient taking differently: TAKE 0.5 mg TABLET BY MOUTH EVERY DAY)  . citalopram (CELEXA) 20 MG tablet Take 20 mg by mouth daily.  . Coconut Oil 1000 MG CAPS Take by mouth daily.  Marland Kitchen dexlansoprazole (DEXILANT) 60 MG capsule Take 60 mg by mouth daily.  Tery Sanfilippo Calcium (STOOL SOFTENER PO) Take 2 tablets by mouth daily.  . Empagliflozin-Linagliptin (GLYXAMBI) 25-5 MG TABS Take by mouth daily.  . furosemide (LASIX) 20 MG tablet Take 1 tablet (20 mg total) by mouth 2 (two) times daily as needed.  . isosorbide mononitrate (IMDUR) 30 MG 24 hr tablet Take 1 tablet (30 mg total) by mouth daily. (Patient taking differently: Take 15 mg by mouth daily. )  .  levothyroxine (SYNTHROID, LEVOTHROID) 100 MCG tablet Take 100 mcg by mouth daily.  . metaxalone (SKELAXIN) 800 MG tablet Take 800 mg by mouth as needed.    . metFORMIN (GLUCOPHAGE) 500 MG tablet Take 1,000 mg by mouth daily.    . mometasone-formoterol (DULERA) 200-5 MCG/ACT AERO Inhale 2 puffs into the lungs daily.  . montelukast (SINGULAIR) 10 MG tablet Take 10 mg by mouth at bedtime.  . Multiple Vitamin (MULTIVITAMIN) tablet Take 1 tablet by mouth daily.    . nitroGLYCERIN (NITROSTAT) 0.4 MG SL tablet Place 1 tablet (0.4 mg total) under the tongue every 5 (five) minutes as needed for chest pain.  . potassium chloride (K-DUR) 10 MEQ tablet TAKE 1 TABLET BY MOUTH DAILY AS NEEDED  . pregabalin (LYRICA) 75 MG capsule Take 75 mg by mouth daily.   Marland Kitchen RANEXA 500 MG 12 hr tablet TAKE 2 TABLETS BY MOUTH TWICE DAILY  . ranitidine (ZANTAC) 150 MG tablet Take 150 mg by mouth at bedtime.  . [DISCONTINUED] Empagliflozin-Linagliptin 25-5 MG TABS Take 25 mg by mouth once.  . [DISCONTINUED] fexofenadine (ALLEGRA) 180 MG tablet Take 180 mg by mouth daily.    . [DISCONTINUED] omeprazole (PRILOSEC) 40 MG capsule Take 40 mg by mouth daily.  . [DISCONTINUED] ranolazine (RANEXA) 1000 MG SR tablet Take 1 tablet (1,000 mg total) by mouth 2 (two) times daily. (Patient not taking: Reported on 08/05/2015)   No facility-administered encounter medications  on file as of 08/05/2015.    Past Medical History  Diagnosis Date  . HLD (hyperlipidemia)   . HTN (hypertension)   . Hypothyroidism   . DM (diabetes mellitus)     borderline  . DVT (deep venous thrombosis)     L leg 2/2 long car trip  . Coronary artery disease, non-occlusive     a. 2007, non obstructive, symptoms felt to be 2/2 vasospasm   . Pseudotumor cerebri     lumbar to peritoneal shunt  . Obesity   . Asthma   . Hypertriglyceridemia     Past Surgical History  Procedure Laterality Date  . Cesarean section      x3   . Gallbladder surgery    . Lumbar  paritineal shunt    . Ablasion    . Cardiac catheterization  01/27/2015    [facility]    Social History  reports that she has never smoked. She does not have any smokeless tobacco history on file. She reports that she does not drink alcohol or use illicit drugs.  Family History family history includes CAD in her father, maternal aunt, maternal grandmother, and paternal aunt; Heart attack in her father; Heart failure in her brother.   Review of Systems  Constitutional: Negative.   Respiratory: Negative.   Cardiovascular: Negative.        Tachycardia  Gastrointestinal: Negative.   Musculoskeletal: Negative.   Skin: Negative.   Neurological: Negative.   Hematological: Negative.   Psychiatric/Behavioral: Negative.   All other systems reviewed and are negative.   BP 100/64 mmHg  Pulse 80  Ht 5' (1.524 m)  Wt 261 lb (118.389 kg)  BMI 50.97 kg/m2  Physical Exam  Constitutional: She is oriented to person, place, and time. She appears well-developed and well-nourished.  Morbidly obese  HENT:  Head: Normocephalic.  Nose: Nose normal.  Mouth/Throat: Oropharynx is clear and moist.  Eyes: Conjunctivae are normal. Pupils are equal, round, and reactive to light.  Neck: Normal range of motion. Neck supple. No JVD present.  Cardiovascular: Normal rate, regular rhythm, S1 normal, S2 normal, normal heart sounds and intact distal pulses.  Exam reveals no gallop and no friction rub.   No murmur heard. Trace pitting edema to the mid shins bilateral  Pulmonary/Chest: Effort normal and breath sounds normal. No respiratory distress. She has no wheezes. She has no rales. She exhibits no tenderness.  Abdominal: Soft. Bowel sounds are normal. She exhibits no distension. There is no tenderness.  Musculoskeletal: Normal range of motion. She exhibits no edema or tenderness.  Lymphadenopathy:    She has no cervical adenopathy.  Neurological: She is alert and oriented to person, place, and time.  Coordination normal.  Skin: Skin is warm and dry. No rash noted. No erythema.  Psychiatric: She has a normal mood and affect. Her behavior is normal. Judgment and thought content normal.    Assessment and Plan  Nursing note and vitals reviewed.

## 2015-08-05 NOTE — Patient Instructions (Signed)
You are doing well. No medication changes were made.  Please call us if you have new issues that need to be addressed before your next appt.  Your physician wants you to follow-up in: 12 months.  You will receive a reminder letter in the mail two months in advance. If you don't receive a letter, please call our office to schedule the follow-up appointment. 

## 2015-08-05 NOTE — Assessment & Plan Note (Signed)
Currently with no symptoms of angina. No further workup at this time. Continue current medication regimen. 

## 2015-10-24 ENCOUNTER — Other Ambulatory Visit: Payer: Self-pay | Admitting: Cardiovascular Disease

## 2015-11-23 ENCOUNTER — Other Ambulatory Visit: Payer: Self-pay | Admitting: Cardiovascular Disease

## 2015-12-13 ENCOUNTER — Other Ambulatory Visit: Payer: Self-pay | Admitting: Cardiovascular Disease

## 2016-02-19 ENCOUNTER — Other Ambulatory Visit: Payer: Self-pay | Admitting: Cardiovascular Disease

## 2016-04-02 ENCOUNTER — Other Ambulatory Visit: Payer: Self-pay | Admitting: Cardiovascular Disease

## 2016-06-10 ENCOUNTER — Other Ambulatory Visit: Payer: Self-pay | Admitting: Cardiovascular Disease

## 2016-08-06 ENCOUNTER — Ambulatory Visit (INDEPENDENT_AMBULATORY_CARE_PROVIDER_SITE_OTHER): Payer: Managed Care, Other (non HMO) | Admitting: Cardiovascular Disease

## 2016-08-06 ENCOUNTER — Encounter: Payer: Self-pay | Admitting: Cardiovascular Disease

## 2016-08-06 VITALS — BP 130/88 | HR 74 | Ht 60.0 in | Wt 265.0 lb

## 2016-08-06 DIAGNOSIS — R000 Tachycardia, unspecified: Secondary | ICD-10-CM | POA: Diagnosis not present

## 2016-08-06 DIAGNOSIS — R0789 Other chest pain: Secondary | ICD-10-CM

## 2016-08-06 DIAGNOSIS — E119 Type 2 diabetes mellitus without complications: Secondary | ICD-10-CM

## 2016-08-06 DIAGNOSIS — I2510 Atherosclerotic heart disease of native coronary artery without angina pectoris: Secondary | ICD-10-CM

## 2016-08-06 DIAGNOSIS — E785 Hyperlipidemia, unspecified: Secondary | ICD-10-CM

## 2016-08-06 NOTE — Progress Notes (Signed)
Cardiology Office Note  Date:  08/06/2016   ID:  Emma Bauer, DOB 11-Jan-1966, MRN [id number]  PCP:  Emma Roads, MD   Chief Complaint  Patient presents with  . Other    1 yr f/u. Meds reviewed verbally with pt.    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 Continues to battle with her weight Minimal leg edema, sometimes after long car trip Tolerating low-dose bystolic for palpitations  EKG on today's visit shows normal sinus rhythm with rate 74 bpm, no significant ST or T-wave changes. Left axis deviation him a poor R-wave progression to the anterior precordial leads  PMH:   has a past medical history of Asthma; Coronary artery disease, non-occlusive; DM (diabetes mellitus) (HCC); DVT (deep venous thrombosis) (HCC); HLD (hyperlipidemia); HTN (hypertension); Hypertriglyceridemia; Hypothyroidism; Obesity; and Pseudotumor cerebri.  PSH:    Past Surgical History:  Procedure Laterality Date  . ablasion    . CARDIAC CATHETERIZATION  01/27/2015   [facility]  . CESAREAN SECTION     x3   . GALLBLADDER SURGERY    . lumbar paritineal shunt      Current Outpatient Prescriptions  Medication Sig Dispense Refill  . aspirin 81 MG EC tablet Take 81 mg by mouth daily.      Marland Kitchen b complex vitamins capsule Take 1 capsule by mouth daily.      Marland Kitchen BYSTOLIC 10 MG tablet TAKE 1 TABLET BY MOUTH EVERY  DAY 30 tablet 6  . citalopram (CELEXA) 20 MG tablet Take 20 mg by mouth daily.    Marland Kitchen dexlansoprazole (DEXILANT) 60 MG capsule Take 60 mg by mouth daily.    Tery Sanfilippo Calcium (STOOL SOFTENER PO) Take 2 tablets by mouth daily.    . Empagliflozin-Linagliptin (GLYXAMBI) 25-5 MG TABS Take by mouth daily.    . furosemide (LASIX) 20 MG tablet Take 1 tablet (20 mg total) by mouth 2 (two) times daily as needed. 60 tablet 6  . isosorbide mononitrate (IMDUR) 30 MG 24 hr tablet TAKE 1 TABLET BY MOUTH DAILY 30 tablet 3  . levothyroxine (SYNTHROID, LEVOTHROID) 100 MCG tablet Take 100 mcg by mouth daily.    . metaxalone (SKELAXIN) 800 MG tablet Take 800 mg by mouth as needed.      . metFORMIN (GLUCOPHAGE) 500 MG tablet Take 1,000 mg by mouth daily.      . mometasone-formoterol (DULERA) 200-5 MCG/ACT AERO Inhale 2 puffs into the lungs daily.    . montelukast (SINGULAIR) 10 MG tablet Take 10 mg by mouth at bedtime.    . Multiple Vitamin (MULTIVITAMIN) tablet Take 1 tablet by mouth daily.      . nitroGLYCERIN (NITROSTAT) 0.4 MG SL tablet Place 1 tablet (0.4 mg total) under the tongue every 5 (five) minutes as needed for chest pain. 25 tablet 6  . potassium chloride (K-DUR) 10 MEQ tablet TAKE 1 TABLET BY MOUTH DAILY AS NEEDED 30  tablet 3  . pregabalin (LYRICA) 75 MG capsule Take 75 mg by mouth daily.     Marland Kitchen. RANEXA 500 MG 12 hr tablet TAKE 2 TABLETS BY MOUTH TWICE DAILY 120 tablet 3  . ranitidine (ZANTAC) 150 MG tablet Take 150 mg by mouth at bedtime.     No current facility-administered medications for this visit.      Allergies:   Penicillins   Social History:  The patient  reports that she has never smoked. She has never used smokeless tobacco. She reports that she does not drink alcohol or use drugs.   Family History:   family history includes CAD in her father, maternal aunt, maternal grandmother, and paternal aunt; Heart attack in her father; Heart failure in her brother.    Review of Systems: Review  of Systems  Constitutional: Negative.   Respiratory: Negative.   Cardiovascular: Negative.   Gastrointestinal: Negative.   Musculoskeletal: Negative.   Neurological: Negative.   Psychiatric/Behavioral: Negative.   All other systems reviewed and are negative.    PHYSICAL EXAM: VS:  BP 130/88 (BP Location: Left Arm, Patient Position: Sitting, Cuff Size: Large)   Pulse 74   Ht 5' (1.524 m)   Wt 265 lb (120.2 kg)   BMI 51.75 kg/m  , BMI Body mass index is 51.75 kg/m. GEN: Well nourished, well developed, in no acute distress, obese  HEENT: normal  Neck: no JVD, carotid bruits, or masses Cardiac: RRR; no murmurs, rubs, or gallops, trace lower extremity edema bilaterally Respiratory:  clear to auscultation bilaterally, normal work of breathing GI: soft, nontender, nondistended, + BS MS: no deformity or atrophy  Skin: warm and dry, no rash Neuro:  Strength and sensation are intact Psych: euthymic mood, full affect    Recent Labs: No results found for requested labs within last 8760 hours.    Lipid Panel Lab Results  Component Value Date   CHOL 155 01/06/2015   HDL 28 (L) 01/06/2015   LDLCALC 51 01/06/2015   TRIG 379 (H) 01/06/2015      Wt Readings from Last 3 Encounters:  08/06/16 265 lb (120.2 kg)  08/05/15 261 lb (118.4 kg)  02/03/15 253 lb 8 oz (115 kg)       ASSESSMENT AND PLAN:  Coronary artery disease, non-occlusive - Plan: EKG 12-Lead, CANCELED: EKG 12-Lead Previous cardiac workup showing no significant coronary disease Currently denies any chest pain, no further workup at this time  Tachycardia - Plan: EKG 12-Lead, CANCELED: EKG 12-Lead Recommended she continue her beta blocker Coupon provided  HLD (hyperlipidemia) Currently not on a cholesterol medication, elevated triglycerides Recommended weight loss, exercise  Controlled type 2 diabetes mellitus without complication, without long-term current use of insulin (HCC) We have encouraged continued  exercise, careful diet management in an effort to lose weight.  Morbid obesity due to excess calories (HCC) Recommended low carbohydrate diet, exercise  CHEST DISCOMFORT We will continue low-dose isosorbide and ranexa. Chest pain symptoms have resolved   Total encounter time more than 25 minutes  Greater than 50% was spent in counseling and coordination of care with the patient   Disposition:   F/U  12 months   Orders Placed This Encounter  Procedures  . EKG 12-Lead     Signed, Dossie Arbourim Duwan Adrian, M.D., Ph.D. 08/06/2016  [facility], [place] [phone]

## 2016-08-06 NOTE — Patient Instructions (Signed)
Medication Instructions:    No medication changes made  Lasix as needed for leg swelling  Whole dose of imdur if needed for chest discomfort  Labwork:  No new labs needed  Testing/Procedures:  No further testing at this time   Follow-Up: It was a pleasure seeing you in the office today. Please call us if you have new issues that need to be addressed before your next appt.  [phone]  Your physician wants you to follow-up in: 12 months.  You will receive a reminder letter in the mail two months in advance. If you don't receive a letter, please call our office to schedule the follow-up appointment.  If you need a refill on your cardiac medications before your next appointment, please call your pharmacy.

## 2016-10-13 ENCOUNTER — Other Ambulatory Visit: Payer: Self-pay | Admitting: Cardiovascular Disease

## 2016-11-04 ENCOUNTER — Other Ambulatory Visit: Payer: Self-pay | Admitting: Cardiovascular Disease

## 2016-12-04 ENCOUNTER — Other Ambulatory Visit: Payer: Self-pay | Admitting: Cardiovascular Disease

## 2017-03-10 ENCOUNTER — Other Ambulatory Visit: Payer: Self-pay | Admitting: Cardiovascular Disease

## 2017-04-07 ENCOUNTER — Other Ambulatory Visit: Payer: Self-pay | Admitting: Cardiovascular Disease

## 2017-08-18 NOTE — Progress Notes (Signed)
Cardiology Office Note  Date:  08/19/2017   ID:  Emma Bauer, DOB 1966/06/03, MRN [id number]  PCP:  Emma Roads, MD   Chief Complaint  Patient presents with  . other    12 month follow up. Meds reviewed by the pt. verbally. 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,  remote DVT of the left leg after a long car trip,  cardiac catheterization in 2007 and 2016 for chest pain that showed noncritical CAD,  who presents for routine followup of her chest pain And leg swelling  In follow-up she reports that she is doing well Wearing compression hose, taking Lasix to the summer for leg swelling Improvement with a compression hose BMP reviewed, stable renal function June 2018  Denies any significant chest pain concerning for angina No regular exercise program Tolerating low-dose bystolic for palpitations  Lab work reviewed with her in detail total cholesterol 155, HDL 29, triglycerides 429, glucose 117, normal TSH, creatinine 0.8 hemoglobin A1c 5.9  EKG on today's visit shows normal sinus rhythm with rate 80 bpm, no significant ST or T-wave changes. Left axis deviation   cardiac catheterization 2016 showing no significant coronary artery disease Started on ranexa and low-dose isosorbide (1/2 dose) No further significant chest pain   PMH:   has a past medical history of Asthma; Coronary artery disease, non-occlusive; DM (diabetes mellitus) (HCC); DVT (deep venous thrombosis) (HCC); HLD (hyperlipidemia); HTN (hypertension); Hypertriglyceridemia; Hypothyroidism; Obesity; and Pseudotumor cerebri.  PSH:    Past Surgical History:  Procedure Laterality Date  . ablasion    . CARDIAC CATHETERIZATION  01/27/2015   [facility]  . CESAREAN SECTION     x3   . GALLBLADDER SURGERY    . lumbar paritineal shunt      Current Outpatient Prescriptions   Medication Sig Dispense Refill  . aspirin 81 MG EC tablet Take 81 mg by mouth daily.      Marland Kitchen b complex vitamins capsule Take 1 capsule by mouth daily.      Marland Kitchen BYSTOLIC 10 MG tablet TAKE 1 TABLET BY MOUTH EVERY DAY 30 tablet 2  . citalopram (CELEXA) 20 MG tablet Take 20 mg by mouth daily. Take one and half tablet daily.    Tery Sanfilippo Calcium (STOOL SOFTENER PO) Take 2 tablets by mouth daily.    . furosemide (LASIX) 20 MG tablet Take 1 tablet (20 mg total) by mouth 2 (two) times daily as needed. 60 tablet 6  . isosorbide mononitrate (IMDUR) 30 MG 24 hr tablet TAKE 1 TABLET BY MOUTH DAILY 30 tablet 3  . JENTADUETO 2.5-500 MG TABS Take two tablets daily    . lansoprazole (PREVACID) 30 MG capsule TK 1 C PO D  5  . levothyroxine (SYNTHROID, LEVOTHROID) 100 MCG tablet Take 100 mcg by mouth daily.    . metaxalone (SKELAXIN) 800 MG tablet Take 800 mg by mouth as needed.      . metFORMIN (GLUCOPHAGE) 500 MG tablet Take 1,000 mg by mouth daily.      . montelukast (SINGULAIR) 10 MG tablet Take 10 mg by mouth at bedtime.    . Multiple Vitamin (MULTIVITAMIN) tablet Take 1 tablet by mouth daily.      . nabumetone (RELAFEN) 500 MG tablet nabumetone 500 mg tablet  one tablet twice a day prn foot pain    .  nitroGLYCERIN (NITROSTAT) 0.4 MG SL tablet Place 1 tablet (0.4 mg total) under the tongue every 5 (five) minutes as needed for chest pain. 25 tablet 6  . potassium chloride (K-DUR) 10 MEQ tablet TAKE 1 TABLET BY MOUTH DAILY AS NEEDED 30 tablet 3  . pregabalin (LYRICA) 150 MG capsule Take 150 mg by mouth every evening.    . pregabalin (LYRICA) 75 MG capsule Take 75 mg by mouth every morning.     Marland Kitchen. RANEXA 500 MG 12 hr tablet TAKE 2 TABLETS BY MOUTH TWICE DAILY 360 tablet 3  . ranitidine (ZANTAC) 150 MG tablet Take 150 mg by mouth at bedtime.    Marland Kitchen. zolmitriptan (ZOMIG) 5 MG tablet zolmitriptan 5 mg tablet  1 tablet po onse of headache     No current facility-administered medications for this visit.       Allergies:   Penicillins   Social History:  The patient  reports that she has never smoked. She has never used smokeless tobacco. She reports that she does not drink alcohol or use drugs.   Family History:   family history includes CAD in her father, maternal aunt, maternal grandmother, and paternal aunt; Heart attack in her father; Heart failure in her brother.    Review of Systems: Review of Systems  Constitutional: Negative.   Respiratory: Negative.   Cardiovascular: Negative.   Gastrointestinal: Negative.   Musculoskeletal: Negative.   Neurological: Negative.   Psychiatric/Behavioral: Negative.   All other systems reviewed and are negative.    PHYSICAL EXAM: VS:  BP 110/70 (BP Location: Left Arm, Patient Position: Sitting, Cuff Size: Normal)   Pulse 80   Ht 5' (1.524 m)   Wt 260 lb 4 oz (118 kg)   BMI 50.83 kg/m  , BMI Body mass index is 50.83 kg/m. GEN: Well nourished, well developed, in no acute distress, obese  HEENT: normal  Neck: no JVD, carotid bruits, or masses Cardiac: RRR; no murmurs, rubs, or gallops, trace lower extremity edema bilaterally Respiratory:  clear to auscultation bilaterally, normal work of breathing GI: soft, nontender, nondistended, + BS MS: no deformity or atrophy  Skin: warm and dry, no rash Neuro:  Strength and sensation are intact Psych: euthymic mood, full affect    Recent Labs: No results found for requested labs within last 8760 hours.    Lipid Panel Lab Results  Component Value Date   CHOL 155 01/06/2015   HDL 28 (L) 01/06/2015   LDLCALC 51 01/06/2015   TRIG 379 (H) 01/06/2015      Wt Readings from Last 3 Encounters:  08/19/17 260 lb 4 oz (118 kg)  08/06/16 265 lb (120.2 kg)  08/05/15 261 lb (118.4 kg)       ASSESSMENT AND PLAN:  Coronary artery disease, non-occlusive - Plan: EKG 12-Lead, CANCELED: EKG 12-Lead Previous cardiac workup showing no significant coronary disease Currently with no symptoms of  angina. No further workup at this time. Continue current medication regimen.  Tachycardia - Plan: EKG 12-Lead, CANCELED: EKG 12-Lead Recommended she continue her beta blocker Coupon provided for bystolic   HLD (hyperlipidemia) Currently not on a cholesterol medication, elevated triglycerides. Reasonable cholesterol 150 Recommended weight loss, exercise  Controlled type 2 diabetes mellitus without complication, without long-term current use of insulin (HCC) We have encouraged continued exercise, careful diet management in an effort to lose weight.  Morbid obesity due to excess calories (HCC) Recommended low carbohydrate diet, exercise  CHEST DISCOMFORT continue low-dose isosorbide and ranexa. Chest pain symptoms have  resolved   Total encounter time more than 25 minutes  Greater than 50% was spent in counseling and coordination of care with the patient   Disposition:   F/U  12 months   No orders of the defined types were placed in this encounter.    Signed, Dossie Arbour, M.D., Ph.D. 08/19/2017  [facility] [place], [place] [phone]

## 2017-08-19 ENCOUNTER — Encounter: Payer: Self-pay | Admitting: Cardiovascular Disease

## 2017-08-19 ENCOUNTER — Ambulatory Visit (INDEPENDENT_AMBULATORY_CARE_PROVIDER_SITE_OTHER): Payer: Managed Care, Other (non HMO) | Admitting: Cardiovascular Disease

## 2017-08-19 DIAGNOSIS — E782 Mixed hyperlipidemia: Secondary | ICD-10-CM

## 2017-08-19 DIAGNOSIS — R7309 Other abnormal glucose: Secondary | ICD-10-CM | POA: Diagnosis not present

## 2017-08-19 DIAGNOSIS — I2510 Atherosclerotic heart disease of native coronary artery without angina pectoris: Secondary | ICD-10-CM | POA: Diagnosis not present

## 2017-08-19 DIAGNOSIS — R0789 Other chest pain: Secondary | ICD-10-CM | POA: Diagnosis not present

## 2017-08-19 MED ORDER — NEBIVOLOL HCL 10 MG PO TABS: 10.0000 mg | ORAL_TABLET | Freq: Every day | ORAL | 3 refills | Status: DC

## 2017-08-19 NOTE — Patient Instructions (Signed)

## 2017-11-09 ENCOUNTER — Other Ambulatory Visit: Payer: Self-pay | Admitting: Cardiovascular Disease

## 2018-02-13 ENCOUNTER — Telehealth: Payer: Self-pay | Admitting: Cardiovascular Disease

## 2018-02-13 NOTE — Telephone Encounter (Signed)
Spoke with Patients spouse. Let him know we were currently out of coupons. He stated a 3 months supply is usually $4  And is now currently $105 without the coupon. Patient has about a few weeks left so she did not need any samples immediatly. I provided him with the [email or web address] and he started they would go on there and see if they can get any help that way but he said he could not pay $105 for this medication.

## 2018-02-13 NOTE — Telephone Encounter (Signed)
Pt spouse would like to know if pt can get coupon card for Ranexa

## 2018-02-13 NOTE — Telephone Encounter (Signed)
Can you please look at the note below, Thanks !

## 2018-02-13 NOTE — Telephone Encounter (Signed)
He may want to check with their insurance as well because it could be that they need to meet a deductible. Have them call us if she needs samples before we can figure out a plan.

## 2018-02-13 NOTE — Telephone Encounter (Signed)
No answer. Left detailed message that he may need to check with insurance company to see if there is a deductible that needs to be met and to follow up with contacting Ranexa. Advised to call us if they run into any further issues and we will help in any way possible.

## 2018-02-24 ENCOUNTER — Other Ambulatory Visit: Payer: Self-pay | Admitting: Cardiovascular Disease

## 2018-06-24 ENCOUNTER — Telehealth: Payer: Self-pay | Admitting: Cardiovascular Disease

## 2018-06-24 ENCOUNTER — Telehealth: Payer: Self-pay

## 2018-06-24 MED ORDER — RANOLAZINE ER 500 MG PO TB12: 1000.0000 mg | ORAL_TABLET | Freq: Two times a day (BID) | ORAL | 0 refills | Status: DC

## 2018-06-24 NOTE — Telephone Encounter (Signed)
Patient will call later.

## 2018-06-24 NOTE — Telephone Encounter (Signed)
Refill sent for Ranexa 500 mg take two tablets twice a day [facility] with no refills. The patient needs a follow up in Sept. 2019 before anymore refills given.

## 2018-06-24 NOTE — Telephone Encounter (Signed)
-----   Message from Festus AloeSharon G Crespo, CMA sent at 06/24/2018  8:49 AM EDT ----- Please contact patient for a follow up with Dr. Mariah MillingGollan for Sept. 2019. Thanks, Jasmine DecemberSharon

## 2018-06-26 NOTE — Telephone Encounter (Signed)
Pt scheduled 08/04/18

## 2018-08-01 NOTE — Progress Notes (Signed)
Cardiology Office Note  Date:  08/04/2018   ID:  Emma Bauer, DOB Apr 15, 1966, MRN [id number]  PCP:  Junius RoadsHolt, James B, MD   Chief Complaint  Patient presents with  . Other    12 month follow up. Patient denies chest pain and SOB at this time. Meds reviewed verbally with patient.     HPI:  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,  remote DVT of the left leg after a long car trip,  cardiac catheterization in 2007 and 2016 for chest pain that showed noncritical CAD,  who presents for routine followup of her chest pain And leg swelling  In follow-up she reports that she is doing well No regular exercise program She does not report having significant leg swelling Previously noted compression hose  Lab work reviewed Hemoglobin A1c 5.9 Total chol 140, LDL 60  started on fenofibrate for elevated triglycerides 400 down to 360  Rare palpitations Rare chest pain on imdur and ranexa On low-dose beta blockers Nodizziness  EKG personally reviewed by myself on todays visit hows normal sinus rhythm rate 73 bpm no significant ST or T-wave changes    cardiac catheterization 2016 showing no significant coronary artery disease Started on ranexa and low-dose isosorbide (1/2 dose) No further significant chest pain   PMH:   has a past medical history of Asthma, Coronary artery disease, non-occlusive, DM (diabetes mellitus) (HCC), DVT (deep venous thrombosis) (HCC), HLD (hyperlipidemia), HTN (hypertension), Hypertriglyceridemia, Hypothyroidism, Obesity, and Pseudotumor cerebri.  PSH:    Past Surgical History:  Procedure Laterality Date  . ablasion    . CARDIAC CATHETERIZATION  01/27/2015   [facility]  . CESAREAN SECTION     x3   . GALLBLADDER SURGERY    . lumbar paritineal shunt      Current Outpatient Medications  Medication Sig Dispense Refill  . aspirin 81 MG  EC tablet Take 81 mg by mouth daily.      Marland Kitchen. b complex vitamins capsule Take 1 capsule by mouth daily.      . cetirizine (ZYRTEC) 10 MG tablet Take 10 mg by mouth daily.    . citalopram (CELEXA) 20 MG tablet Take 20 mg by mouth daily. Take one and half tablet daily.    Tery Sanfilippo. Docusate Calcium (STOOL SOFTENER PO) Take 2 tablets by mouth daily.    . furosemide (LASIX) 20 MG tablet Take 1 tablet (20 mg total) by mouth 2 (two) times daily as needed. 60 tablet 6  . isosorbide dinitrate (ISORDIL) 30 MG tablet Take 15 mg by mouth.    . JENTADUETO 2.5-500 MG TABS Take two tablets daily    . lansoprazole (PREVACID) 30 MG capsule TK 1 C PO D  5  . levothyroxine (SYNTHROID, LEVOTHROID) 100 MCG tablet Take 100 mcg by mouth daily.    . metaxalone (SKELAXIN) 800 MG tablet Take 800 mg by mouth as needed.      . metFORMIN (GLUCOPHAGE) 500 MG tablet Take 1,000 mg by mouth daily.      . montelukast (SINGULAIR) 10 MG tablet Take 10 mg by mouth at bedtime.    . Multiple Vitamin (MULTIVITAMIN) tablet Take 1 tablet by mouth daily.      . nabumetone (RELAFEN) 500 MG tablet nabumetone 500 mg tablet  one tablet twice a day prn foot pain    . nebivolol (BYSTOLIC) 10 MG tablet Take 1  tablet (10 mg total) by mouth daily. 90 tablet 3  . nitroGLYCERIN (NITROSTAT) 0.4 MG SL tablet Place 1 tablet (0.4 mg total) under the tongue every 5 (five) minutes as needed for chest pain. 25 tablet 6  . potassium chloride (K-DUR) 10 MEQ tablet TAKE 1 TABLET BY MOUTH DAILY AS NEEDED 30 tablet 3  . pregabalin (LYRICA) 150 MG capsule Take 150 mg by mouth every evening.    . pregabalin (LYRICA) 75 MG capsule Take 75 mg by mouth every morning.     . ranitidine (ZANTAC) 150 MG tablet Take 150 mg by mouth 2 (two) times daily.     . ranolazine (RANEXA) 500 MG 12 hr tablet Take 2 tablets (1,000 mg total) by mouth 2 (two) times daily. 360 tablet 0  . zolmitriptan (ZOMIG) 5 MG tablet zolmitriptan 5 mg tablet  1 tablet po onse of headache     No  current facility-administered medications for this visit.      Allergies:   Penicillins   Social History:  The patient  reports that she has never smoked. She has never used smokeless tobacco. She reports that she does not drink alcohol or use drugs.   Family History:   family history includes CAD in her father, maternal aunt, maternal grandmother, and paternal aunt; Heart attack in her father; Heart failure in her brother.    Review of Systems: Review of Systems  Constitutional: Negative.   Respiratory: Negative.   Cardiovascular: Negative.   Gastrointestinal: Negative.   Musculoskeletal: Negative.   Neurological: Negative.   Psychiatric/Behavioral: Negative.   All other systems reviewed and are negative.    PHYSICAL EXAM: VS:  BP 128/76 (BP Location: Left Arm, Patient Position: Sitting, Cuff Size: Large)   Pulse 73   Ht 5\' 1"  (1.549 m)   Wt 264 lb (119.7 kg)   BMI 49.88 kg/m  , BMI Body mass index is 49.88 kg/m. GEN: Well nourished, well developed, in no acute distress, obese  HEENT: normal  Neck: no JVD, carotid bruits, or masses Cardiac: RRR; no murmurs, rubs, or gallops, trace lower extremity edema bilaterally Respiratory:  clear to auscultation bilaterally, normal work of breathing GI: soft, nontender, nondistended, + BS MS: no deformity or atrophy  Skin: warm and dry, no rash Neuro:  Strength and sensation are intact Psych: euthymic mood, full affect    Recent Labs: No results found for requested labs within last 8760 hours.    Lipid Panel Lab Results  Component Value Date   CHOL 155 01/06/2015   HDL 28 (L) 01/06/2015   LDLCALC 51 01/06/2015   TRIG 379 (H) 01/06/2015      Wt Readings from Last 3 Encounters:  08/04/18 264 lb (119.7 kg)  08/19/17 260 lb 4 oz (118 kg)  08/06/16 265 lb (120.2 kg)      ASSESSMENT AND PLAN:  Coronary artery disease, non-occlusive - Small vessel disease on prior cardiac catheterization No large vessel disease No  further workup needed  Tachycardia - Coupon provided for bystolic  Rate well controlled  HLD (hyperlipidemia) Reasonable cholesterol 150 Recommended weight loss, exercise Recommended low carbohydratediet  Controlled type 2 diabetes mellitus without complication, without long-term current use of insulin (HCC) We have encouraged continued exercise, careful diet management in an effort to lose weight.has been reports starting a walking program  Morbid obesity due to excess calories (HCC) Recommended low carbohydrate diet, exercise  CHEST DISCOMFORT continue low-dose isosorbide and ranexa. Chest pain symptoms have resolved Rare symptoms  Total encounter time more than 25 minutes  Greater than 50% was spent in counseling and coordination of care with the patient   Disposition:   F/U  12 months   Orders Placed This Encounter  Procedures  . EKG 12-Lead     Signed, Dossie Arbour, M.D., Ph.D. 08/04/2018  [facility] [place], [place] [phone]

## 2018-08-04 ENCOUNTER — Ambulatory Visit (INDEPENDENT_AMBULATORY_CARE_PROVIDER_SITE_OTHER): Payer: 59 | Admitting: Cardiovascular Disease

## 2018-08-04 VITALS — BP 128/76 | HR 73 | Ht 61.0 in | Wt 264.0 lb

## 2018-08-04 DIAGNOSIS — R7309 Other abnormal glucose: Secondary | ICD-10-CM | POA: Diagnosis not present

## 2018-08-04 DIAGNOSIS — R0789 Other chest pain: Secondary | ICD-10-CM | POA: Diagnosis not present

## 2018-08-04 DIAGNOSIS — I2510 Atherosclerotic heart disease of native coronary artery without angina pectoris: Secondary | ICD-10-CM

## 2018-08-04 DIAGNOSIS — E119 Type 2 diabetes mellitus without complications: Secondary | ICD-10-CM

## 2018-08-04 DIAGNOSIS — E782 Mixed hyperlipidemia: Secondary | ICD-10-CM

## 2018-08-04 NOTE — Patient Instructions (Signed)

## 2018-09-22 ENCOUNTER — Other Ambulatory Visit: Payer: Self-pay | Admitting: Cardiovascular Disease

## 2018-12-05 ENCOUNTER — Other Ambulatory Visit: Payer: Self-pay | Admitting: Cardiovascular Disease

## 2018-12-10 ENCOUNTER — Other Ambulatory Visit: Payer: Self-pay | Admitting: Cardiovascular Disease

## 2019-08-11 ENCOUNTER — Telehealth: Payer: Self-pay

## 2019-08-11 NOTE — Telephone Encounter (Signed)
Left a message for Emma Bauer to contact our office regarding a prior authorization for Isosorbide mono 30 mg. The patient also needs a follow up appointment with Dr. Rockey Situ.

## 2019-08-11 NOTE — Telephone Encounter (Signed)
Spoke with Revolva (pharmacy tech) there is no prior authorization required for the Isosorbide mono 30mg  one tablet daily.

## 2019-08-14 ENCOUNTER — Encounter: Payer: Self-pay | Admitting: Cardiovascular Disease

## 2019-09-08 ENCOUNTER — Emergency Department (HOSPITAL_COMMUNITY): Payer: 59

## 2019-09-08 ENCOUNTER — Encounter (HOSPITAL_COMMUNITY): Payer: Self-pay | Admitting: Emergency Medicine

## 2019-09-08 ENCOUNTER — Emergency Department (HOSPITAL_COMMUNITY)
Admission: EM | Admit: 2019-09-08 | Discharge: 2019-09-08 | Disposition: A | Discharge status: Home / Self Care | Payer: 59 | Attending: Emergency Medicine | Admitting: Emergency Medicine

## 2019-09-08 ENCOUNTER — Telehealth: Payer: Self-pay | Admitting: Cardiovascular Disease

## 2019-09-08 ENCOUNTER — Other Ambulatory Visit: Payer: Self-pay

## 2019-09-08 DIAGNOSIS — E039 Hypothyroidism, unspecified: Secondary | ICD-10-CM | POA: Diagnosis not present

## 2019-09-08 DIAGNOSIS — Z7984 Long term (current) use of oral hypoglycemic drugs: Secondary | ICD-10-CM | POA: Diagnosis not present

## 2019-09-08 DIAGNOSIS — Z7982 Long term (current) use of aspirin: Secondary | ICD-10-CM | POA: Diagnosis not present

## 2019-09-08 DIAGNOSIS — E119 Type 2 diabetes mellitus without complications: Secondary | ICD-10-CM | POA: Insufficient documentation

## 2019-09-08 DIAGNOSIS — Z86718 Personal history of other venous thrombosis and embolism: Secondary | ICD-10-CM | POA: Diagnosis not present

## 2019-09-08 DIAGNOSIS — I2510 Atherosclerotic heart disease of native coronary artery without angina pectoris: Secondary | ICD-10-CM | POA: Insufficient documentation

## 2019-09-08 DIAGNOSIS — Z79899 Other long term (current) drug therapy: Secondary | ICD-10-CM | POA: Insufficient documentation

## 2019-09-08 DIAGNOSIS — R0789 Other chest pain: Secondary | ICD-10-CM | POA: Diagnosis present

## 2019-09-08 DIAGNOSIS — R079 Chest pain, unspecified: Secondary | ICD-10-CM

## 2019-09-08 DIAGNOSIS — I10 Essential (primary) hypertension: Secondary | ICD-10-CM | POA: Diagnosis not present

## 2019-09-08 LAB — CBC
HCT: 33.9 % — ABNORMAL LOW (ref 36.0–46.0)
Hemoglobin: 10.8 g/dL — ABNORMAL LOW (ref 12.0–15.0)
MCH: 28.6 pg (ref 26.0–34.0)
MCHC: 31.9 g/dL (ref 30.0–36.0)
MCV: 89.7 fL (ref 80.0–100.0)
Platelets: 270 10*3/uL (ref 150–400)
RBC: 3.78 MIL/uL — ABNORMAL LOW (ref 3.87–5.11)
RDW: 13.8 % (ref 11.5–15.5)
WBC: 8.1 10*3/uL (ref 4.0–10.5)
nRBC: 0 % (ref 0.0–0.2)

## 2019-09-08 LAB — BASIC METABOLIC PANEL
Anion gap: 11 (ref 5–15)
BUN: 11 mg/dL (ref 6–20)
CO2: 23 mmol/L (ref 22–32)
Calcium: 9.3 mg/dL (ref 8.9–10.3)
Chloride: 105 mmol/L (ref 98–111)
Creatinine, Ser: 0.7 mg/dL (ref 0.44–1.00)
GFR calc Af Amer: >60 mL/min (ref >60)
GFR calc non Af Amer: >60 mL/min (ref >60)
Glucose, Bld: 112 mg/dL — ABNORMAL HIGH (ref 70–99)
Potassium: 3.9 mmol/L (ref 3.5–5.1)
Sodium: 139 mmol/L (ref 135–145)

## 2019-09-08 LAB — I-STAT BETA HCG BLOOD, ED (MC, WL, AP ONLY): I-stat hCG, quantitative: <5 m[IU]/mL (ref <5)

## 2019-09-08 LAB — TROPONIN I (HIGH SENSITIVITY)
Troponin I (High Sensitivity): 3 ng/L (ref <18)
Troponin I (High Sensitivity): 3 ng/L (ref <18)

## 2019-09-08 MED ORDER — SODIUM CHLORIDE 0.9% FLUSH: 3.0000 mL | Freq: Once | INTRAVENOUS | Status: DC

## 2019-09-08 MED ORDER — NITROGLYCERIN 0.4 MG SL SUBL
0.4000 mg | SUBLINGUAL_TABLET | Freq: Once | SUBLINGUAL | Status: AC
  Administered 2019-09-08: 21:00:00 0.4 mg via SUBLINGUAL
  Filled 2019-09-08: qty 1

## 2019-09-08 NOTE — ED Notes (Addendum)
Pt reports switching to the generic form Ranexa last week.

## 2019-09-08 NOTE — ED Triage Notes (Signed)
Pt presents with mid to L through to the back chest pain onset last night, with mild shob. Pt took Nitro today brought pain down to 5/10 from 10/10.

## 2019-09-08 NOTE — Telephone Encounter (Signed)
Incoming call from Emma Bauer, okay per DPR.   Pt having continuous chest pain that radiates to back that started last night and is worsening. Also c/o SOB and diaphoresis. Taking NTG and ranexa. No current vs available at this time.   I advised that pt to seek urgent medical care for worsening chest pain. Follow up appt Tuesday 10/6 10:20 A was confirmed per husband request.

## 2019-09-08 NOTE — Discharge Instructions (Signed)
Please follow up closely with your cardiologist for further evaluation of your chest discomfort.  Return to the ER if your condition worsen or if you develop any shortness of breath or worsening swelling.

## 2019-09-08 NOTE — ED Notes (Signed)
Patient verbalizes understanding of discharge instructions. Opportunity for questioning and answers were provided. Armband removed by staff, pt discharged from ED.  

## 2019-09-08 NOTE — ED Provider Notes (Signed)
[facility] EMERGENCY DEPARTMENT Provider Note   CSN: [id number] Arrival date & time: 09/08/19  1516     History   Chief Complaint Chief Complaint  Patient presents with  . Chest Pain    HPI Emma Bauer is a 53 y.o. female.     The history is provided by the patient and medical records. No language interpreter was used.  Chest Pain    53 year old female with history of diabetes, CAD, hypertension, hyperlipidemia, obesity, asthma presenting for evaluation of chest pain.  Patient report for the past 2 days she has noticed pain about her mid chest that radiates to her back.  Pain is described as a uncomfortable sensation, mild but persistent.  She did felt a bit diaphoretic earlier today, and few hours ago she took a sublingual nitro which mostly improve her pain but it has not fully resolved.  Patient endorsed some mild dizziness not associate with pain.  She did reach out to her cardiologist who recommend patient to come to ER for further evaluation.  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  She has since been on Bystolic and Ranexa which has provide a adequate relief of her symptoms.  A week ago her insurance no longer cover Ranexa and patient was switched to a generic form of that.  She is unsure if the new medication may have contributed to her symptoms.  She also report remote history of DVT more than 20 years ago.  She did notice some mild leg swelling in which she took her Lasix but noticed no improvement.  She denies any recent surgery, prolonged bedrest, active cancer, shortness of breath or hemoptysis.  Patient denies history of alcohol or tobacco use.  Past Medical History:  Diagnosis Date  . Asthma   . Coronary artery disease, non-occlusive    a. 2007, non obstructive, symptoms felt to be 2/2 vasospasm   . DM (diabetes mellitus) (HCC)    borderline  . DVT (deep venous thrombosis) (HCC)    L leg 2/2 long  car trip  . HLD (hyperlipidemia)   . HTN (hypertension)   . Hypertriglyceridemia   . Hypothyroidism   . Obesity   . Pseudotumor cerebri    lumbar to peritoneal shunt    Patient Active Problem List   Diagnosis Date Noted  . Coronary artery disease, non-occlusive   . Hypertriglyceridemia   . Asthma   . Tachycardia 08/19/2012  . Edema 07/12/2011  . Diabetes type 2, controlled (HCC) 01/17/2010  . CAD 01/17/2010  . CHEST DISCOMFORT 01/17/2010  . DEEP VENOUS THROMBOPHLEBITIS, LEFT, LEG, HX OF 01/17/2010  . HYPOTHYROIDISM 01/12/2010  . HLD (hyperlipidemia) 01/12/2010  . OBESITY-MORBID (>100') 01/12/2010  . PRE-DIABETES 01/12/2010  . Atypical chest pain 01/12/2010    Past Surgical History:  Procedure Laterality Date  . ablasion    . CARDIAC CATHETERIZATION  01/27/2015   [facility]  . CESAREAN SECTION     x3   . GALLBLADDER SURGERY    . lumbar paritineal shunt       OB History   No obstetric history on file.      Home Medications    Prior to Admission medications   Medication Sig Start Date End Date Taking? Authorizing Provider  aspirin 81 MG EC tablet Take 81 mg by mouth daily.      [provider]  b complex vitamins capsule Take 1 capsule by mouth daily.  [provider]  BYSTOLIC 10 MG tablet TAKE 1 TABLET(10 MG) BY MOUTH DAILY 09/22/18   Minna Merritts, MD  cetirizine (ZYRTEC) 10 MG tablet Take 10 mg by mouth daily.    [provider]  citalopram (CELEXA) 20 MG tablet Take 20 mg by mouth daily. Take one and half tablet daily.    [provider]  Docusate Calcium (STOOL SOFTENER PO) Take 2 tablets by mouth daily.    [provider]  furosemide (LASIX) 20 MG tablet Take 1 tablet (20 mg total) by mouth 2 (two) times daily as needed. 02/22/14   Minna Merritts, MD  isosorbide dinitrate (ISORDIL) 30 MG tablet Take 15 mg by mouth.    [provider]  isosorbide mononitrate (IMDUR) 30 MG 24 hr tablet TAKE 1 TABLET BY  MOUTH DAILY 12/11/18   Minna Merritts, MD  JENTADUETO 2.5-500 MG TABS Take two tablets daily 07/27/17   [provider]  lansoprazole (PREVACID) 30 MG capsule TK 1 C PO D 08/03/17   [provider]  levothyroxine (SYNTHROID, LEVOTHROID) 100 MCG tablet Take 100 mcg by mouth daily.    [provider]  metaxalone (SKELAXIN) 800 MG tablet Take 800 mg by mouth as needed.      [provider]  metFORMIN (GLUCOPHAGE) 500 MG tablet Take 1,000 mg by mouth daily.      [provider]  montelukast (SINGULAIR) 10 MG tablet Take 10 mg by mouth at bedtime.    [provider]  Multiple Vitamin (MULTIVITAMIN) tablet Take 1 tablet by mouth daily.      [provider]  nabumetone (RELAFEN) 500 MG tablet nabumetone 500 mg tablet  one tablet twice a day prn foot pain    [provider]  nitroGLYCERIN (NITROSTAT) 0.4 MG SL tablet Place 1 tablet (0.4 mg total) under the tongue every 5 (five) minutes as needed for chest pain. 01/06/15   Minna Merritts, MD  potassium chloride (K-DUR) 10 MEQ tablet TAKE 1 TABLET BY MOUTH DAILY AS NEEDED 09/06/14   Minna Merritts, MD  pregabalin (LYRICA) 150 MG capsule Take 150 mg by mouth every evening.    [provider]  pregabalin (LYRICA) 75 MG capsule Take 75 mg by mouth every morning.     [provider]  RANEXA 500 MG 12 hr tablet TAKE 2 TABLETS BY MOUTH TWICE DAILY 12/05/18   Minna Merritts, MD  ranitidine (ZANTAC) 150 MG tablet Take 150 mg by mouth 2 (two) times daily.     [provider]  zolmitriptan (ZOMIG) 5 MG tablet zolmitriptan 5 mg tablet  1 tablet po onse of headache    [provider]    Family History Family History  Problem Relation Age of Onset  . CAD Father   . Heart attack Father        MI x 3 (age of first MI unknown, 2nd MI at age 25, age of 14rd MI 59-->which unfortunately caused his death)  . Heart failure Brother   . CAD Maternal Grandmother    . CAD Maternal Aunt   . CAD Paternal Aunt     Social History Social History   Tobacco Use  . Smoking status: Never Smoker  . Smokeless tobacco: Never Used  . Tobacco comment: tobacco use - no  Substance Use Topics  . Alcohol use: No  . Drug use: No     Allergies   Penicillins   Review of Systems Review  of Systems  Cardiovascular: Positive for chest pain.  All other systems reviewed and are negative.    Physical Exam Updated Vital Signs BP 119/66   Pulse 79   Temp 98.3 F (36.8 C) (Oral)   Resp (!) 21   Ht 5' (1.524 m)   Wt 119 kg   SpO2 97%   BMI 51.24 kg/m   Physical Exam Vitals signs and nursing note reviewed.  Constitutional:      General: She is not in acute distress.    Appearance: She is well-developed. She is obese.  HENT:     Head: Atraumatic.  Eyes:     Conjunctiva/sclera: Conjunctivae normal.  Neck:     Musculoskeletal: Neck supple.  Cardiovascular:     Rate and Rhythm: Normal rate and regular rhythm.  Pulmonary:     Effort: Pulmonary effort is normal.     Breath sounds: Normal breath sounds. No wheezing, rhonchi or rales.  Abdominal:     General: Abdomen is flat.     Palpations: Abdomen is soft.     Tenderness: There is no abdominal tenderness.  Musculoskeletal:        General: No swelling.     Comments: Bilateral extremities without any significant edema and no calf tenderness.  Dorsalis pedis pulse palpable bilaterally.  Skin:    Findings: No rash.  Neurological:     Mental Status: She is alert.      ED Treatments / Results  Labs (all labs ordered are listed, but only abnormal results are displayed) Labs Reviewed  BASIC METABOLIC PANEL - Abnormal; Notable for the following components:      Result Value   Glucose, Bld 112 (*)    All other components within normal limits  CBC - Abnormal; Notable for the following components:   RBC 3.78 (*)    Hemoglobin 10.8 (*)    HCT 33.9 (*)    All other components within normal  limits  I-STAT BETA HCG BLOOD, ED ([facility], [facility], [facility] ONLY)  TROPONIN I (HIGH SENSITIVITY)  TROPONIN I (HIGH SENSITIVITY)    EKG EKG Interpretation  Date/Time:  Tuesday September 08 2019 15:19:50 EDT Ventricular Rate:  78 PR Interval:  180 QRS Duration: 76 QT Interval:  370 QTC Calculation: 421 R Axis:   -2 Text Interpretation:  Normal sinus rhythm Low voltage QRS Cannot rule out Anterior infarct , age undetermined Abnormal ECG No previous ECGs available Confirmed by Vanetta Mulders [phone]) on 09/08/2019 8:11:46 PM   Radiology Dg Chest 2 View  Result Date: 09/08/2019 CLINICAL DATA:  Mid chest pain starting yesterday. Mild shortness breath. EXAM: CHEST - 2 VIEW COMPARISON:  01/17/2015 FINDINGS: The lungs appear clear.  Cardiac and mediastinal contours normal. No pleural effusion identified. Thoracic spondylosis.  No acute bony findings are identified. IMPRESSION: 1.  No active cardiopulmonary disease is radiographically apparent. Electronically Signed   By: Gaylyn Rong M.D.   On: 09/08/2019 16:05    Procedures Procedures (including critical care time)  Medications Ordered in ED Medications  sodium chloride flush (NS) 0.9 % injection 3 mL (has no administration in time range)  nitroGLYCERIN (NITROSTAT) SL tablet 0.4 mg (0.4 mg Sublingual Given 09/08/19 2115)     Initial Impression / Assessment and Plan / ED Course  I have reviewed the triage vital signs and the nursing notes.  Pertinent labs & imaging results that were available during my care of the patient were reviewed by me and considered in my medical decision making (see chart  for details).        BP 105/70 (BP Location: Right Arm)   Pulse 84   Temp 98.3 F (36.8 C) (Oral)   Resp 17   Ht 5' (1.524 m)   Wt 119 kg   SpO2 96%   BMI 51.24 kg/m    Final Clinical Impressions(s) / ED Diagnoses   Final diagnoses:  Nonspecific chest pain    ED Discharge Orders    None     8:08 PM Pt here with persistent CP  x 2 days.  Pain relieved with nitro.  She is well appearing, HEART score of 4.  EKG unremarkable, negative delta troponin.  Her labs are reassuring.  I have low suspicion for ACS or PE given her presentation.  She does not complain of any shortness of breath.  Her leg swelling is not appreciable on exam to suggest DVT.  Chest x-ray unremarkable, low suspicion for CHF. Encourage pt to f/u with cardiology for outpt evaluation.  Care discussed with Dr. Deretha EmoryZackowski.   10:15 PM Negative delta troponin, low suspicion for ACS.  At this time patient is pain-free.  She does have a cardiologist and I encourage patient to follow-up closely for further care.  Return precaution discussed. Since pt mentioned the start of her sxs shortly after her Ranexa was discontinue and she is now taking a generic form of the medication.  I encourage pt to discuss this with her doctor.    Fayrene Helperran, Rangel Echeverri, PA-C 09/08/19 2234    Vanetta MuldersZackowski, Scott, MD 09/21/19 1757

## 2019-09-08 NOTE — Telephone Encounter (Signed)
Pt c/o of Chest Pain: STAT if CP now or developed within 24 hours  1. Are you having CP right now? now  2. Are you experiencing any other symptoms (ex. SOB, nausea, vomiting, sweating)? SOB, hurting in chest, sweating  3. How long have you been experiencing CP? Last night  4. Is your CP continuous or coming and going? Gets worse with deep breaths  5. Have you taken Nitroglycerin? Yes  Also states that pt is now on generic renexa and not sure if that would be a factor ?

## 2019-09-09 ENCOUNTER — Telehealth: Payer: Self-pay | Admitting: Cardiovascular Disease

## 2019-09-09 MED ORDER — RANOLAZINE ER 500 MG PO TB12: 500.0000 mg | ORAL_TABLET | Freq: Two times a day (BID) | ORAL | 6 refills | Status: DC

## 2019-09-09 MED ORDER — RANOLAZINE ER 500 MG PO TB12: 1000.0000 mg | ORAL_TABLET | Freq: Two times a day (BID) | ORAL | 0 refills | Status: DC

## 2019-09-09 NOTE — Addendum Note (Signed)
Addended by: Vanessa Ralphs on: 09/09/2019 05:19 PM   Modules accepted: Orders

## 2019-09-09 NOTE — Telephone Encounter (Signed)
°*  STAT* If patient is at the pharmacy, call can be transferred to refill team.   1. Which medications need to be refilled? (please list name of each medication and dose if known) Ranexa 500 m po BID NO SUBSTITUTIONS per spouse patient seen at [facility] for issues with generic and cannot tolerate   2. Which pharmacy/location (including street and city if local pharmacy) is medication to be sent to?       [place]   3. Do they need a 30 day or 90 day supply? [place]

## 2019-09-09 NOTE — Telephone Encounter (Signed)
Patient has upcoming appointment on 09/15/19. Last OV note has Ranexa 500 mg tablet - 2 tablets by mouth two times a day. Rx sent with message to keep upcoming appointment for further refills.

## 2019-09-09 NOTE — Telephone Encounter (Signed)
Patient spouse calling  States that refill was sent in incorrectly They need Ranexa 500 MG 2 tablets twice a day Product selection not permitted Needs a 90 day sent to [facility] in [facility] - would like completed this afternoon if possible  Please advise

## 2019-09-09 NOTE — Telephone Encounter (Signed)
Please advise for Ranexa.

## 2019-09-09 NOTE — Telephone Encounter (Signed)
Please see note below pt requesting Ranexa 500 mg tablet 2 tablets bid.  Pt last ov note say Ranexa 500 mg bid.   Please advise.

## 2019-09-14 ENCOUNTER — Telehealth: Payer: Self-pay

## 2019-09-14 MED ORDER — RANOLAZINE ER 500 MG PO TB12: 1000.0000 mg | ORAL_TABLET | Freq: Two times a day (BID) | ORAL | 0 refills | Status: DC

## 2019-09-14 NOTE — Progress Notes (Signed)
Cardiology Office Note  Date:  09/15/2019   ID:  Emma Bauer, DOB 08-25-1966, MRN [id number]  PCP:  Everlene Farrier, MD   Chief Complaint  Patient presents with  . Other    12 month follow up. patient c/o chest pain and SOB. Was in the ED last week for chest pain. Meds reviewed verbally with patient.     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 HBA1C 6.9 TSH 10  EKG personally reviewed by myself on todays visit hows normal sinus rhythm rate 85 bpm, LAD,  no significant ST or T-wave changes  Other past medical hx  cardiac catheterization 2016 showing no significant coronary artery disease Started on ranexa and low-dose isosorbide (1/2 dose) No further significant chest pain   PMH:   has a past medical history of Asthma, Coronary artery disease, non-occlusive, DM (diabetes mellitus) ([place]), DVT (deep venous thrombosis) ([place]), HLD (hyperlipidemia), HTN (hypertension), Hypertriglyceridemia, Hypothyroidism, Obesity, and Pseudotumor cerebri.  PSH:    Past Surgical History:  Procedure Laterality Date  . ablasion    . CARDIAC CATHETERIZATION  01/27/2015   [facility]  . CESAREAN SECTION     x3   . GALLBLADDER SURGERY    . lumbar paritineal shunt      Current Outpatient Medications  Medication Sig Dispense Refill  . aspirin 81 MG EC tablet Take 81 mg by mouth daily.      Marland Kitchen b complex vitamins  capsule Take 1 capsule by mouth daily.      Marland Kitchen BYSTOLIC 10 MG tablet TAKE 1 TABLET(10 MG) BY MOUTH DAILY (Patient taking differently: Take 10 mg by mouth daily. ) 90 tablet 2  . cetirizine (ZYRTEC) 10 MG tablet Take 10 mg by mouth daily.    . Cholecalciferol (VITAMIN D3) 125 MCG (5000 UT) TABS Take 1 tablet by mouth daily.    . citalopram (CELEXA) 20 MG tablet Take 20 mg by mouth daily. Take one and half tablet daily.    . Cyanocobalamin (VITAMIN B-12) 5000 MCG TBDP Take 5,000 mcg by mouth daily.    Mariane Baumgarten Calcium (STOOL SOFTENER PO) Take 2 tablets by mouth daily.    . furosemide (LASIX) 20 MG tablet Take 1 tablet (20 mg total) by mouth 2 (two) times daily as needed. (Patient taking differently: Take 20 mg by mouth 2 (two) times daily as needed for fluid. ) 60 tablet 6  . isosorbide dinitrate (ISORDIL) 30 MG tablet Take 15 mg by mouth daily.     . JENTADUETO 2.5-500 MG TABS Take 1 tablet by mouth 2 (two) times daily.     . lansoprazole (PREVACID) 30 MG capsule Take 30 mg by mouth daily.   5  . magnesium gluconate (MAGONATE) 500 MG tablet Take 1,000 mg by mouth 2 (two) times daily.    . metFORMIN (GLUCOPHAGE) 500 MG tablet Take 1,000 mg by mouth daily.      Marland Kitchen  montelukast (SINGULAIR) 10 MG tablet Take 10 mg by mouth daily.     . Multiple Vitamin (MULTIVITAMIN) tablet Take 1 tablet by mouth daily.      . nitroGLYCERIN (NITROSTAT) 0.4 MG SL tablet Place 1 tablet (0.4 mg total) under the tongue every 5 (five) minutes as needed for chest pain. 25 tablet 6  . pregabalin (LYRICA) 75 MG capsule Take 75 mg by mouth 2 (two) times daily.     . ranolazine (RANEXA) 500 MG 12 hr tablet Take 2 tablets (1,000 mg total) by mouth 2 (two) times daily. Please keep upcoming appointment for further refills! Thanks! 120 tablet 0  . thyroid (ARMOUR THYROID) 120 MG tablet Take 120 mg by mouth daily.    Marland Kitchen. zinc gluconate 50 MG tablet Take 50 mg by mouth daily.    Marland Kitchen. zolmitriptan (ZOMIG) 5 MG tablet Take 5 mg by mouth as  needed (headache).      No current facility-administered medications for this visit.      Allergies:   Penicillins   Social History:  The patient  reports that she has never smoked. She has never used smokeless tobacco. She reports that she does not drink alcohol or use drugs.   Family History:   family history includes CAD in her father, maternal aunt, maternal grandmother, and paternal aunt; Heart attack in her father; Heart failure in her brother.    Review of Systems: Review of Systems  Constitutional: Negative.   Respiratory: Negative.   Cardiovascular: Positive for chest pain and leg swelling.  Gastrointestinal: Negative.   Musculoskeletal: Negative.   Neurological: Negative.   Psychiatric/Behavioral: Negative.   All other systems reviewed and are negative.    PHYSICAL EXAM: VS:  BP 138/70 (BP Location: Left Arm, Patient Position: Sitting, Cuff Size: Large)   Pulse 85   Ht 5\' 1"  (1.549 m)   Wt 254 lb (115.2 kg)   BMI 47.99 kg/m  , BMI Body mass index is 47.99 kg/m. GEN: Well nourished, well developed, in no acute distress, obese  HEENT: normal  Neck: no JVD, carotid bruits, or masses Cardiac: RRR; no murmurs, rubs, or gallops, trace lower extremity edema bilaterally Respiratory:  clear to auscultation bilaterally, normal work of breathing GI: soft, nontender, nondistended, + BS MS: no deformity or atrophy  Skin: warm and dry, no rash Neuro:  Strength and sensation are intact Psych: euthymic mood, full affect   Recent Labs: 09/08/2019: BUN 11; Creatinine, Ser 0.70; Hemoglobin 10.8; Platelets 270; Potassium 3.9; Sodium 139    Lipid Panel Lab Results  Component Value Date   CHOL 155 01/06/2015   HDL 28 (L) 01/06/2015   LDLCALC 51 01/06/2015   TRIG 379 (H) 01/06/2015      Wt Readings from Last 3 Encounters:  09/15/19 254 lb (115.2 kg)  09/08/19 262 lb 5.6 oz (119 kg)  08/04/18 264 lb (119.7 kg)      ASSESSMENT AND PLAN:  Coronary artery disease,  non-occlusive - Small vessel disease on prior cardiac catheterization No large vessel disease No further workup needed She is having stuttering chest pain with lower extremity edema, she contributes this to starting generic Ranexa She is requesting branded Ranexa Also noticed that she was changed from isosorbide mononitrate to isosorbide dinitrate.  She is taking half pill We will change her back to isosorbide mononitrate 30 mg daily  Tachycardia - Refill provided of her beta-blocker Has not been a major issue recently  HLD (hyperlipidemia)  Cholesterol LDL at goal, triglycerides  elevated Stressed importance of more aggressive diet  Controlled type 2 diabetes mellitus without complication, without long-term current use of insulin (HCC) Ideally may benefit from gastric bypass, not discussed with her today Stressed importance of dietary changes, walking program  Morbid obesity due to excess calories (HCC) Recommended low carbohydrate diet, exercise And follow-up consider discussion for gastric bypass surgery  CHEST DISCOMFORT Chronic atypical, isosorbide mononitrate 30 daily with branded Ranexa if available through insurance   Total encounter time more than 25 minutes  Greater than 50% was spent in counseling and coordination of care with the patient   Disposition:   F/U  12 months   Orders Placed This Encounter  Procedures  . EKG 12-Lead     Signed, Dossie Arbour, M.D., Ph.D. 09/15/2019  [facility] [place], [place] [phone]

## 2019-09-14 NOTE — Telephone Encounter (Signed)
Requested Prescriptions   Signed Prescriptions Disp Refills  . ranolazine (RANEXA) 500 MG 12 hr tablet 120 tablet 0    Sig: Take 2 tablets (1,000 mg total) by mouth 2 (two) times daily. Please keep upcoming appointment for further refills! Thanks!    Authorizing Provider: Minna Merritts    Ordering User: Raelene Bott, BRANDY L

## 2019-09-15 ENCOUNTER — Encounter: Payer: Self-pay | Admitting: Cardiovascular Disease

## 2019-09-15 ENCOUNTER — Other Ambulatory Visit: Payer: Self-pay | Admitting: Cardiovascular Disease

## 2019-09-15 ENCOUNTER — Ambulatory Visit (INDEPENDENT_AMBULATORY_CARE_PROVIDER_SITE_OTHER): Payer: 59 | Admitting: Cardiovascular Disease

## 2019-09-15 ENCOUNTER — Other Ambulatory Visit: Payer: Self-pay

## 2019-09-15 VITALS — BP 138/70 | HR 85 | Ht 61.0 in | Wt 254.0 lb

## 2019-09-15 DIAGNOSIS — E119 Type 2 diabetes mellitus without complications: Secondary | ICD-10-CM | POA: Diagnosis not present

## 2019-09-15 DIAGNOSIS — E782 Mixed hyperlipidemia: Secondary | ICD-10-CM | POA: Diagnosis not present

## 2019-09-15 DIAGNOSIS — R0789 Other chest pain: Secondary | ICD-10-CM

## 2019-09-15 DIAGNOSIS — I2510 Atherosclerotic heart disease of native coronary artery without angina pectoris: Secondary | ICD-10-CM

## 2019-09-15 DIAGNOSIS — R7309 Other abnormal glucose: Secondary | ICD-10-CM | POA: Diagnosis not present

## 2019-09-15 MED ORDER — RANEXA 500 MG PO TB12: 1000.0000 mg | ORAL_TABLET | Freq: Two times a day (BID) | ORAL | 3 refills | Status: DC

## 2019-09-15 MED ORDER — ISOSORBIDE MONONITRATE ER 30 MG PO TB24: 30.0000 mg | ORAL_TABLET | Freq: Every day | ORAL | 3 refills | Status: DC

## 2019-09-15 NOTE — Patient Instructions (Addendum)
We will send in a new script for BRANDED Ranexa 500 mg  Pills (1000 mg in the AM and 1000 mg in the PM) [facility], 90 days, 3 refills  Medication Instructions:   Please change the isosorbide dinitrate  to isosorbide mononitrate 30 mg once a day  If you need a refill on your cardiac medications before your next appointment, please call your pharmacy.    Lab work: No new labs needed   If you have labs (blood work) drawn today and your tests are completely normal, you will receive your results only by: Marland Kitchen MyChart Message (if you have MyChart) OR . A paper copy in the mail If you have any lab test that is abnormal or we need to change your treatment, we will call you to review the results.   Testing/Procedures: No new testing needed   Follow-Up: At [facility], you and your health needs are our priority.  As part of our continuing mission to provide you with exceptional heart care, we have created designated Provider Care Teams.  These Care Teams include your primary Cardiologist (physician) and Advanced Practice Providers (APPs -  Physician Assistants and Nurse Practitioners) who all work together to provide you with the care you need, when you need it.  . You will need a follow up appointment in 12 months .   Please call our office 2 months in advance to schedule this appointment.    . Providers on your designated Care Team:   . Murray Hodgkins, NP . Christell Faith, PA-C . Marrianne Mood, PA-C  Any Other Special Instructions Will Be Listed Below (If Applicable).  For educational health videos Log in to : [email or web address] Or : [email or web address], password : [email or web address]

## 2019-10-06 ENCOUNTER — Other Ambulatory Visit: Payer: Self-pay | Admitting: Cardiovascular Disease

## 2019-10-06 NOTE — Telephone Encounter (Signed)
Please see pop up note when trying to refill Ranexa 500 mg tablet. Ranexa is not preferred medication by insurance plan please see alternate.

## 2019-10-23 ENCOUNTER — Telehealth: Payer: Self-pay

## 2019-10-23 MED ORDER — NEBIVOLOL HCL 10 MG PO TABS: ORAL_TABLET | ORAL | 3 refills | Status: DC

## 2019-10-23 NOTE — Telephone Encounter (Signed)
Requested Prescriptions   Signed Prescriptions Disp Refills  . nebivolol (BYSTOLIC) 10 MG tablet 90 tablet 3    Sig: TAKE 1 TABLET(10 MG) BY MOUTH DAILY    Authorizing Provider: Minna Merritts    Ordering User: Raelene Bott, Clarice Bonaventure L

## 2019-11-03 ENCOUNTER — Other Ambulatory Visit: Payer: Self-pay

## 2019-11-03 NOTE — Telephone Encounter (Signed)
*  STAT* If patient is at the pharmacy, call can be transferred to refill team.   1. Which medications need to be refilled? (please list name of each medication and dose if known) Bystolic  2. Which pharmacy/location (including street and city if local pharmacy) is medication to be sent to? [place]  3. Do they need a 30 day or 90 day supply? [place]

## 2019-11-21 ENCOUNTER — Emergency Department: Payer: 59

## 2019-11-21 ENCOUNTER — Other Ambulatory Visit: Payer: Self-pay

## 2019-11-21 ENCOUNTER — Encounter: Payer: Self-pay | Admitting: Emergency Medicine

## 2019-11-21 ENCOUNTER — Emergency Department
Admission: EM | Admit: 2019-11-21 | Discharge: 2019-11-21 | Disposition: A | Discharge status: Home / Self Care | Payer: 59 | Attending: Emergency Medicine | Admitting: Emergency Medicine

## 2019-11-21 DIAGNOSIS — G932 Benign intracranial hypertension: Secondary | ICD-10-CM | POA: Diagnosis not present

## 2019-11-21 DIAGNOSIS — H11422 Conjunctival edema, left eye: Secondary | ICD-10-CM | POA: Diagnosis not present

## 2019-11-21 DIAGNOSIS — I2510 Atherosclerotic heart disease of native coronary artery without angina pectoris: Secondary | ICD-10-CM | POA: Diagnosis not present

## 2019-11-21 DIAGNOSIS — Z7982 Long term (current) use of aspirin: Secondary | ICD-10-CM | POA: Insufficient documentation

## 2019-11-21 DIAGNOSIS — J45909 Unspecified asthma, uncomplicated: Secondary | ICD-10-CM | POA: Insufficient documentation

## 2019-11-21 DIAGNOSIS — Z79899 Other long term (current) drug therapy: Secondary | ICD-10-CM | POA: Diagnosis not present

## 2019-11-21 DIAGNOSIS — E039 Hypothyroidism, unspecified: Secondary | ICD-10-CM | POA: Diagnosis not present

## 2019-11-21 DIAGNOSIS — I10 Essential (primary) hypertension: Secondary | ICD-10-CM | POA: Insufficient documentation

## 2019-11-21 DIAGNOSIS — E119 Type 2 diabetes mellitus without complications: Secondary | ICD-10-CM | POA: Diagnosis not present

## 2019-11-21 DIAGNOSIS — H5712 Ocular pain, left eye: Secondary | ICD-10-CM | POA: Diagnosis present

## 2019-11-21 DIAGNOSIS — H1132 Conjunctival hemorrhage, left eye: Secondary | ICD-10-CM | POA: Diagnosis not present

## 2019-11-21 LAB — BASIC METABOLIC PANEL
Anion gap: 10 (ref 5–15)
BUN: 15 mg/dL (ref 6–20)
CO2: 25 mmol/L (ref 22–32)
Calcium: 9.1 mg/dL (ref 8.9–10.3)
Chloride: 106 mmol/L (ref 98–111)
Creatinine, Ser: 0.68 mg/dL (ref 0.44–1.00)
GFR calc Af Amer: >60 mL/min (ref >60)
GFR calc non Af Amer: >60 mL/min (ref >60)
Glucose, Bld: 113 mg/dL — ABNORMAL HIGH (ref 70–99)
Potassium: 4.4 mmol/L (ref 3.5–5.1)
Sodium: 141 mmol/L (ref 135–145)

## 2019-11-21 LAB — CBC
HCT: 36 % (ref 36.0–46.0)
Hemoglobin: 11.4 g/dL — ABNORMAL LOW (ref 12.0–15.0)
MCH: 25.9 pg — ABNORMAL LOW (ref 26.0–34.0)
MCHC: 31.7 g/dL (ref 30.0–36.0)
MCV: 81.8 fL (ref 80.0–100.0)
Platelets: 280 10*3/uL (ref 150–400)
RBC: 4.4 MIL/uL (ref 3.87–5.11)
RDW: 14.2 % (ref 11.5–15.5)
WBC: 8.1 10*3/uL (ref 4.0–10.5)
nRBC: 0 % (ref 0.0–0.2)

## 2019-11-21 LAB — SEDIMENTATION RATE: Sed Rate: 14 mm/hr (ref 0–30)

## 2019-11-21 LAB — TROPONIN I (HIGH SENSITIVITY): Troponin I (High Sensitivity): 2 ng/L (ref <18)

## 2019-11-21 MED ORDER — PROPARACAINE HCL 0.5 % OP SOLN: 1.0000 [drp] | Freq: Once | OPHTHALMIC | Status: DC

## 2019-11-21 MED ORDER — FLUORESCEIN SODIUM 1 MG OP STRP
1.0000 | ORAL_STRIP | Freq: Once | OPHTHALMIC | Status: AC
  Administered 2019-11-21: 14:00:00 1 via OPHTHALMIC
  Filled 2019-11-21: qty 1

## 2019-11-21 MED ORDER — TETRACAINE HCL 0.5 % OP SOLN
2.0000 [drp] | Freq: Once | OPHTHALMIC | Status: AC
  Administered 2019-11-21: 14:00:00 2 [drp] via OPHTHALMIC
  Filled 2019-11-21: qty 4

## 2019-11-21 MED ORDER — SODIUM CHLORIDE 0.9% FLUSH: 3.0000 mL | Freq: Once | INTRAVENOUS | Status: DC

## 2019-11-21 NOTE — ED Notes (Addendum)
Pt present to the ED with L eye pain and headache in the temporal area that started 1100, pt also c/o blurry vision and light sensitivity. On assessment, pt's sclera is red. Pt denies taking blood thinners. Pt is A&Ox4.

## 2019-11-21 NOTE — ED Provider Notes (Addendum)
[facility] Emergency Department Provider Note   ____________________________________________   First MD Initiated Contact with Patient 11/21/19 1322     (approximate)  I have reviewed the triage vital signs and the nursing notes.   HISTORY  Chief Complaint Eye Pain    HPI Emma Bauer is a 53 y.o. female with past medical history of CAD, diabetes, and pseudotumor cerebri status post lumbar peritoneal shunt presents to the ED complaining of eye pain.  Patient reports onset of headache around 11:00 this morning, when she began to notice bleeding on the surface of her left eye.  She describes the pain as a constant throbbing affecting her left temple and just above her left eye.  Pain is not exacerbated or alleviated by anything.  She has not had any associated photophobia, nausea, vomiting, but does state that her vision seems more blurry than usual.  She states the surface of her eye feels irritated and she is sensitive to light.  She has not had any recent trauma to that eye and does not currently take any blood thinners beyond a daily baby aspirin.  She became concerned when area of blood over her left eye seemed to expand, and so she sought care in the ED.        Past Medical History:  Diagnosis Date  . Asthma   . Coronary artery disease, non-occlusive    a. 2007, non obstructive, symptoms felt to be 2/2 vasospasm   . DM (diabetes mellitus) ([place])    borderline  . DVT (deep venous thrombosis) (H[facility])    L leg 2/2 long car trip  . HLD (hyperlipidemia)   . HTN (hypertension)   . Hypertriglyceridemia   . Hypothyroidism   . Obesity   . Pseudotumor cerebri    lumbar to peritoneal shunt    Patient Active Problem List   Diagnosis Date Noted  . Coronary artery disease, non-occlusive   . Hypertriglyceridemia   . Asthma   . Tachycardia 08/19/2012  . Edema 07/12/2011  . Diabetes type 2, controlled ([place]) 01/17/2010  . CAD 01/17/2010  . CHEST DISCOMFORT  01/17/2010  . DEEP VENOUS THROMBOPHLEBITIS, LEFT, LEG, HX OF 01/17/2010  . HYPOTHYROIDISM 01/12/2010  . HLD (hyperlipidemia) 01/12/2010  . OBESITY-MORBID (>100') 01/12/2010  . PRE-DIABETES 01/12/2010  . Atypical chest pain 01/12/2010    Past Surgical History:  Procedure Laterality Date  . ablasion    . CARDIAC CATHETERIZATION  01/27/2015   [facility]  . CESAREAN SECTION     x3   . GALLBLADDER SURGERY    . lumbar paritineal shunt      Prior to Admission medications   Medication Sig Start Date End Date Taking? Authorizing Provider  aspirin 81 MG EC tablet Take 81 mg by mouth daily.      [provider]  b complex vitamins capsule Take 1 capsule by mouth daily.      [provider]  cetirizine (ZYRTEC) 10 MG tablet Take 10 mg by mouth daily.    [provider]  Cholecalciferol (VITAMIN D3) 125 MCG (5000 UT) TABS Take 1 tablet by mouth daily.    [provider]  citalopram (CELEXA) 20 MG tablet Take 20 mg by mouth daily. Take one and half tablet daily.    [provider]  Cyanocobalamin (VITAMIN B-12) 5000 MCG TBDP Take 5,000 mcg by mouth daily.    [provider]  Docusate Calcium (STOOL SOFTENER PO) Take 2 tablets by mouth daily.  [provider]  furosemide (LASIX) 20 MG tablet Take 1 tablet (20 mg total) by mouth 2 (two) times daily as needed. Patient taking differently: Take 20 mg by mouth 2 (two) times daily as needed for fluid.  02/22/14   Minna Merritts, MD  isosorbide mononitrate (IMDUR) 30 MG 24 hr tablet Take 1 tablet (30 mg total) by mouth daily. 09/15/19 12/14/19  Gollan, Kathlene November, MD  JENTADUETO 2.5-500 MG TABS Take 1 tablet by mouth 2 (two) times daily.  07/27/17   [provider]  lansoprazole (PREVACID) 30 MG capsule Take 30 mg by mouth daily.  08/03/17   [provider]  magnesium gluconate (MAGONATE) 500 MG tablet Take 1,000 mg by mouth 2 (two) times daily.    [provider]    metFORMIN (GLUCOPHAGE) 500 MG tablet Take 1,000 mg by mouth daily.      [provider]  montelukast (SINGULAIR) 10 MG tablet Take 10 mg by mouth daily.     [provider]  Multiple Vitamin (MULTIVITAMIN) tablet Take 1 tablet by mouth daily.      [provider]  nebivolol (BYSTOLIC) 10 MG tablet TAKE 1 TABLET(10 MG) BY MOUTH DAILY 10/23/19   Gollan, Kathlene November, MD  nitroGLYCERIN (NITROSTAT) 0.4 MG SL tablet Place 1 tablet (0.4 mg total) under the tongue every 5 (five) minutes as needed for chest pain. 01/06/15   Minna Merritts, MD  pregabalin (LYRICA) 75 MG capsule Take 75 mg by mouth 2 (two) times daily.     [provider]  RANEXA 500 MG 12 hr tablet Take 2 tablets (1,000 mg total) by mouth 2 (two) times daily. 09/15/19   Minna Merritts, MD  thyroid (ARMOUR THYROID) 120 MG tablet Take 120 mg by mouth daily.    [provider]  zinc gluconate 50 MG tablet Take 50 mg by mouth daily.    [provider]  zolmitriptan (ZOMIG) 5 MG tablet Take 5 mg by mouth as needed (headache).     [provider]    Allergies Penicillins  Family History  Problem Relation Age of Onset  . CAD Father   . Heart attack Father        MI x 3 (age of first MI unknown, 2nd MI at age 63, age of 26rd MI 59-->which unfortunately caused his death)  . Heart failure Brother   . CAD Maternal Grandmother   . CAD Maternal Aunt   . CAD Paternal Aunt     Social History Social History   Tobacco Use  . Smoking status: Never Smoker  . Smokeless tobacco: Never Used  . Tobacco comment: tobacco use - no  Substance Use Topics  . Alcohol use: No  . Drug use: No    Review of Systems  Constitutional: No fever/chills Eyes: Positive for visual changes. ENT: No sore throat. Cardiovascular: Denies chest pain. Respiratory: Denies shortness of breath. Gastrointestinal: No abdominal pain.  No nausea, no vomiting.  No diarrhea.  No  constipation. Genitourinary: Negative for dysuria. Musculoskeletal: Negative for back pain. Skin: Negative for rash. Neurological: Negative for headaches, focal weakness or numbness.  ____________________________________________   PHYSICAL EXAM:  VITAL SIGNS: ED Triage Vitals  Enc Vitals Group     BP 11/21/19 1207 (!) 165/102     Pulse Rate 11/21/19 1207 79     Resp 11/21/19 1207 18     Temp 11/21/19 1207 98.1 F (36.7 C)     Temp Source 11/21/19 1207  Oral     SpO2 11/21/19 1207 98 %     Weight 11/21/19 1207 253 lb (114.8 kg)     Height 11/21/19 1207 5' 1"  (1.549 m)     Head Circumference --      Peak Flow --      Pain Score 11/21/19 1213 3     Pain Loc --      Pain Edu? --      Excl. in [place]? --     Constitutional: Alert and oriented. Eyes: Conjunctival hemorrhage noted on left with very mild chemosis, no hyphema.  Pupils equal round and reactive to light bilaterally, extraocular movements intact. Head: Atraumatic.  No temporal tenderness bilaterally. Nose: No congestion/rhinnorhea. Mouth/Throat: Mucous membranes are moist. Neck: Normal ROM Cardiovascular: Normal rate, regular rhythm. Grossly normal heart sounds. Respiratory: Normal respiratory effort.  No retractions. Lungs CTAB. Gastrointestinal: Soft and nontender. No distention. Genitourinary: deferred Musculoskeletal: No lower extremity tenderness nor edema. Neurologic:  Normal speech and language. No gross focal neurologic deficits are appreciated.  5-5 strength in bilateral upper and lower extremities, no pronator drift. Skin:  Skin is warm, dry and intact. No rash noted. Psychiatric: Mood and affect are normal. Speech and behavior are normal.  ____________________________________________   LABS (all labs ordered are listed, but only abnormal results are displayed)  Labs Reviewed  BASIC METABOLIC PANEL - Abnormal; Notable for the following components:      Result Value   Glucose, Bld 113 (*)    All other  components within normal limits  CBC - Abnormal; Notable for the following components:   Hemoglobin 11.4 (*)    MCH 25.9 (*)    All other components within normal limits  SEDIMENTATION RATE  TROPONIN I (HIGH SENSITIVITY)    PROCEDURES  Procedure(s) performed (including Critical Care):  Procedures  ED ECG REPORT I, Blake Divine, the attending physician, personally viewed and interpreted this ECG.   Date: 12/20/2019  EKG Time: 12:31  Rate: 78  Rhythm: normal sinus rhythm  Axis: LAD  Intervals:none  ST&T Change: None  ____________________________________________   INITIAL IMPRESSION / ASSESSMENT AND PLAN / ED COURSE       53 year old female with history of pseudotumor cerebri and lumbar peritoneal shunt presents to the ED with headache and hemorrhage around left eye.  Appearance of eyes consistent with subconjunctival hemorrhage and there is no evidence of hyphema.  She has no focal deficits on her exam and no meningismus.  No temporal tenderness and ESR within normal limits, doubt temporal arteritis.  She also has no apparent foreign body in eye with eversion of lid and no fluorescein uptake over cornea.  Extraocular pressures measured to be 17 on left and 15 on right.  Head CT is negative for acute process and I doubt there are any issues with her LP shunt.  It would be unusual for patient to develop spontaneous subconjunctival hemorrhage, but she denies any recent trauma, coughing, or Valsalva.  She takes a baby aspirin but no other anticoagulants, platelets within normal limits.  She is likely experiencing some irritation due to mild chemosis, will have patient follow-up closely with ophthalmology and counseled to return to the ED for new or worsening symptoms, patient agrees with plan.      ____________________________________________   FINAL CLINICAL IMPRESSION(S) / ED DIAGNOSES  Final diagnoses:  Subconjunctival hemorrhage of left eye  Chemosis of left conjunctiva      ED Discharge Orders    None  Note:  This document was prepared using Dragon voice recognition software and may include unintentional dictation errors.   Blake Divine, MD 11/21/19 1428    Blake Divine, MD 12/20/19 2217

## 2019-11-21 NOTE — ED Triage Notes (Signed)
Pt via pov from home with redness in sclera of left eye. Pt states she had a small amount of blood earlier in the week, but now the majority of the sclera is bright red. Pt states she feels pressure in the eye, as well as grittiness. Pt also endorees pain in left temple, visual blurriness. Pt alert & oriented with NAD noted.

## 2019-11-21 NOTE — ED Notes (Signed)
Pt ambulated to visual acuity chart without assistance

## 2019-12-26 IMAGING — CT CT HEAD W/O CM
2 series · 15 of 37 positions shown, 18 images · non-contrast
Comparison: None.

CLINICAL DATA: Headache, scleral redness of left eye, eye pressure

EXAM:
CT HEAD WITHOUT CONTRAST
TECHNIQUE: Contiguous axial images were obtained from the base of the skull
through the vertex without intravenous contrast.

[Series 3: head wo · axial · 0.41mm/px · z∈[+486,+611]mm · 12 of 30 slices shown, 15 images]
[im 3/30  brain]
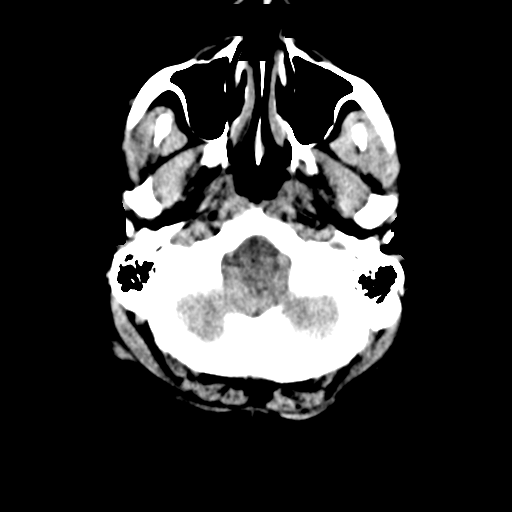
[im 3/30  bone]
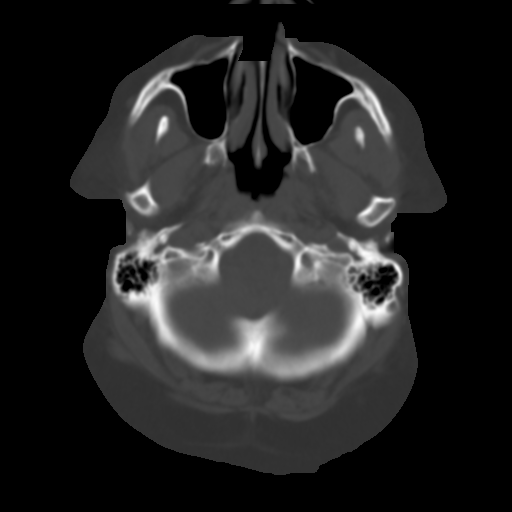
[im 5/30  brain]
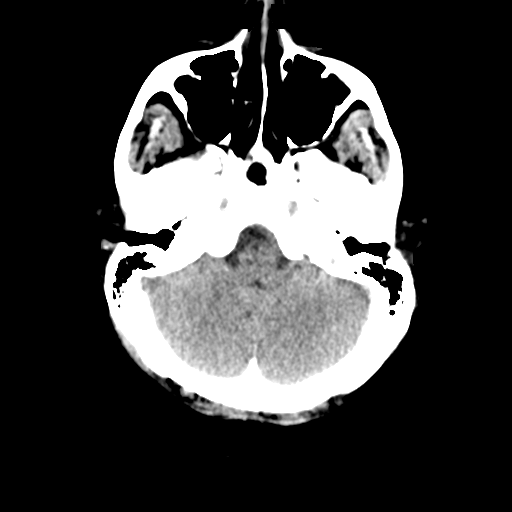
[im 7/30  brain]
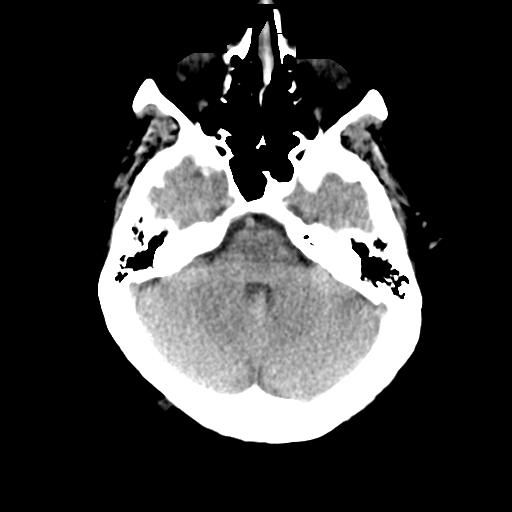
[im 10/30  brain]
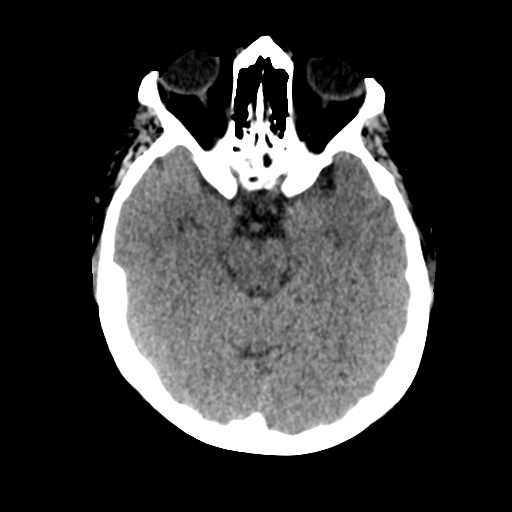
[im 12/30  brain]
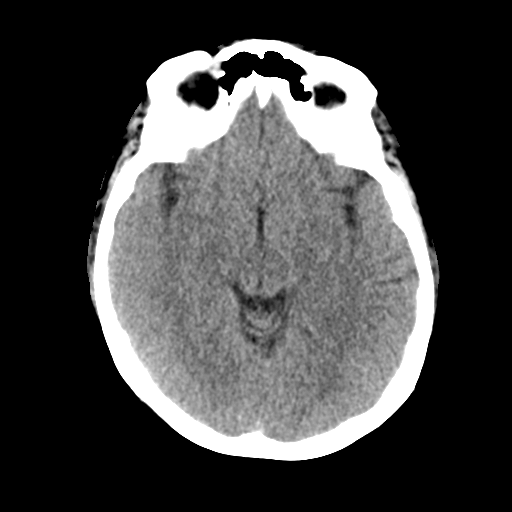
[im 12/30  bone]
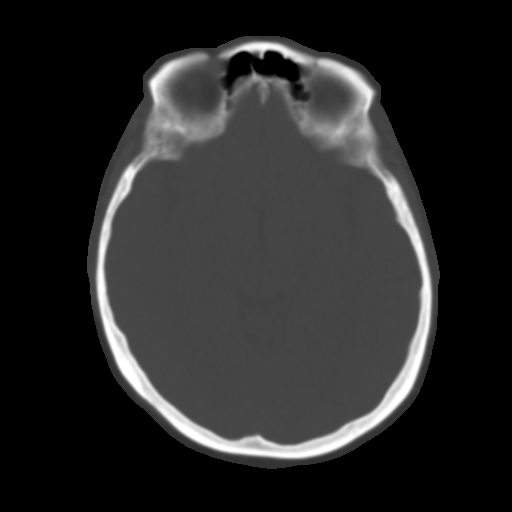
[im 14/30  brain]
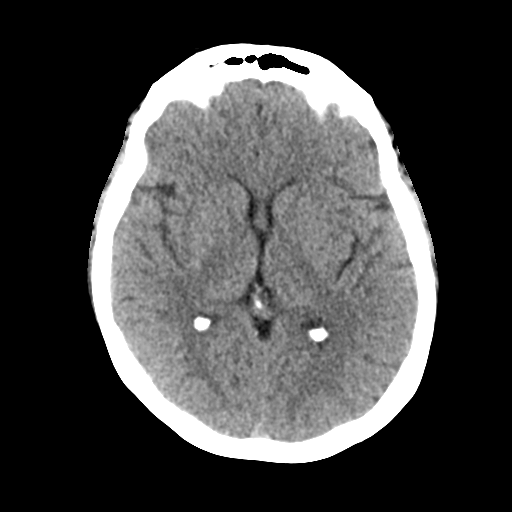
[im 17/30  brain]
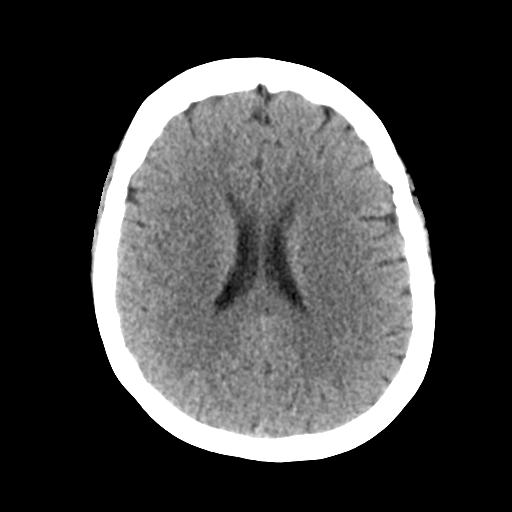
[im 19/30  brain]
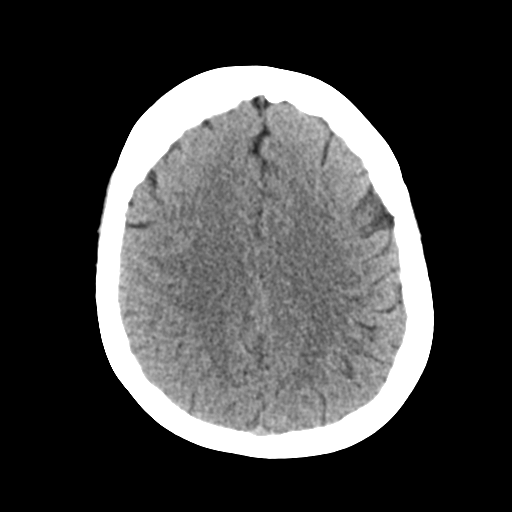
[im 21/30  brain]
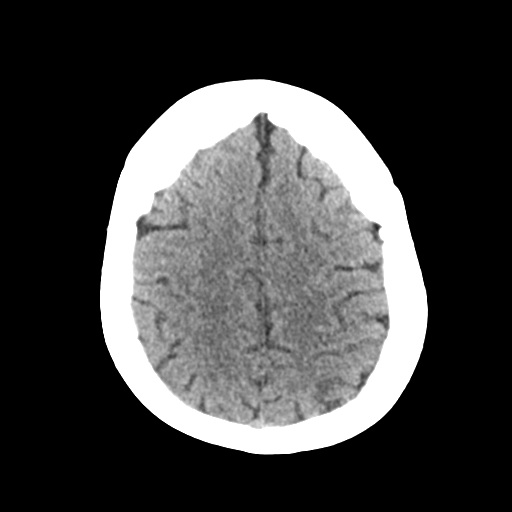
[im 21/30  bone]
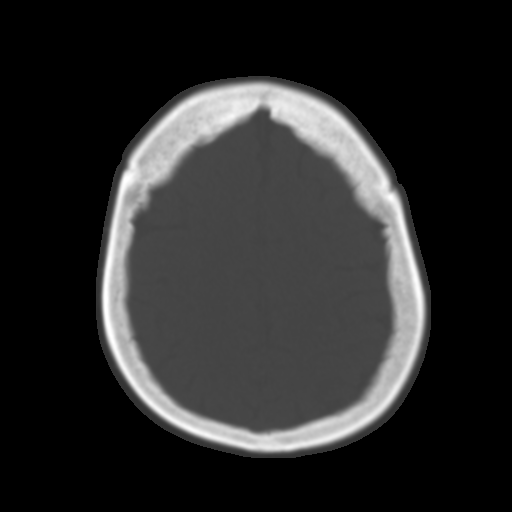
[im 24/30  brain]
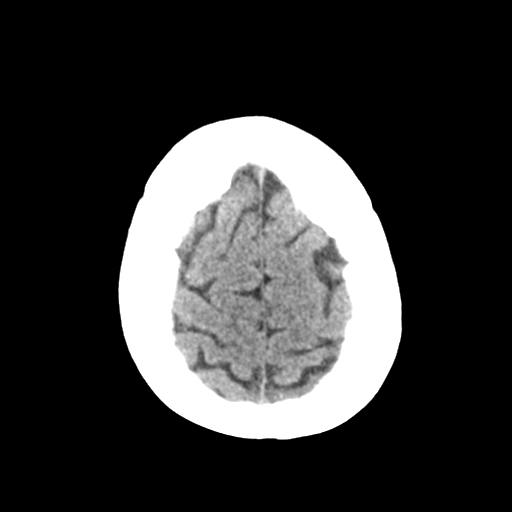
[im 26/30  brain]
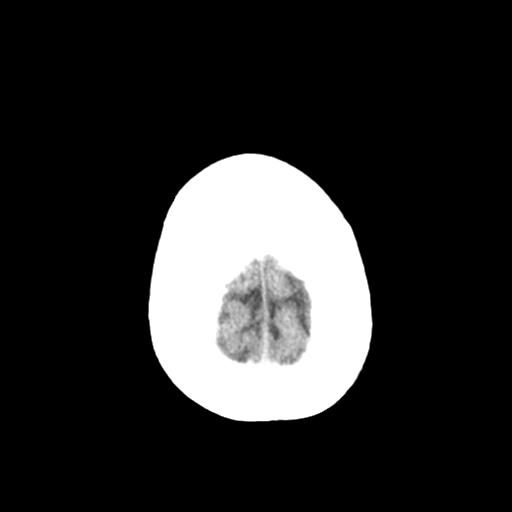
[im 28/30  brain]
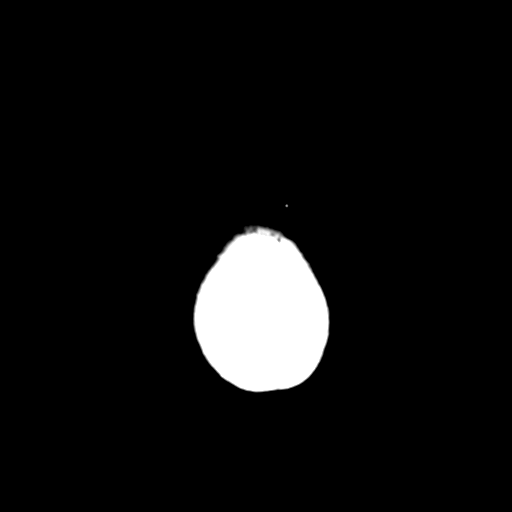

[Series 5: sagittal soft tissue · sagittal · 0.29mm/px · 3 of 49 slices shown]
[im 17/49  brain]
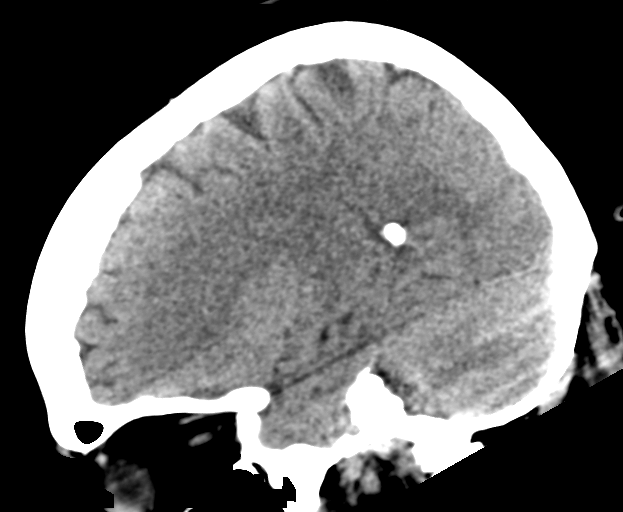
[im 25/49  brain]
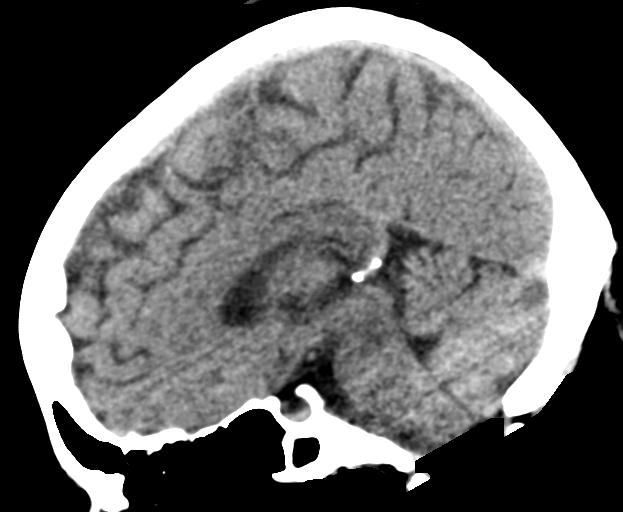
[im 33/49  brain]
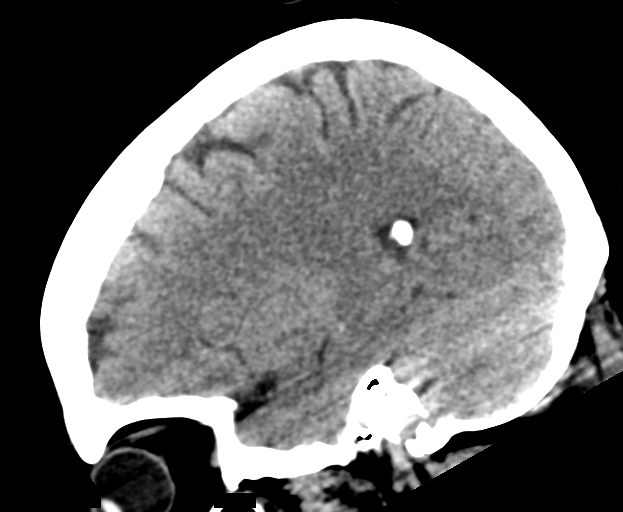

[15 of 37 positions shown; findings below may reference images not displayed]

FINDINGS: Brain: No evidence of acute infarction, hemorrhage, hydrocephalus,
extra-axial collection or mass lesion/mass effect.

Vascular: No hyperdense vessel or unexpected calcification.

Skull: Normal. Negative for fracture or focal lesion.

Sinuses/Orbits: No acute finding.

Other: None.
IMPRESSION: 1.  No acute intracranial pathology

2. No noncontrast CT abnormality of the left orbit on this non
tailored examination.

## 2020-01-05 ENCOUNTER — Telehealth: Payer: Self-pay | Admitting: Cardiovascular Disease

## 2020-01-05 NOTE — Telephone Encounter (Signed)
[facility] calling in to state that Ranexa is not covered under patients insurance. The covered medication is Ranolazine ER 500  Please advise  [facility]#[id number]

## 2020-01-06 NOTE — Telephone Encounter (Signed)
Spoke with patients spouse per release form and he states patient is not able to take the generic form of Ranexa. He provided different number to do authorization over the phone for her to get the brand name medication. [phone]. She has 5 days left of her current supply and let him know I would call them first thing in the AM and call him with outcome. He was appreciative for the call back with no further questions.

## 2020-01-06 NOTE — Telephone Encounter (Signed)
Pateint husband is calling, states they are awaiting to see if Ranexa will be covered and if not another alternative.

## 2020-01-07 NOTE — Telephone Encounter (Signed)
Could you assist with getting brand name? She has intolerance to generic. Apparently they have tried previously and she ended up in hospital.

## 2020-01-07 NOTE — Telephone Encounter (Signed)
Patient calling to check status of auth   Please call

## 2020-01-08 NOTE — Telephone Encounter (Signed)
Called and spoke with Rosalita Chessman at [employer] to initiate a Prior Authorization for [employer] name Ranexa 500mg  2 tablets PO BID #360 for a 90 day supply. Completed coverage questions verbally. Per Ranxea has been approved. Case or Ref# Cleta Alberts. She ran a coverage check to confirm that it would go through at the pharmacy which went through. Coverage start backdated to 12/11/2019. Coverage end date 01/07/2021. Patient informed.

## 2020-01-11 NOTE — Telephone Encounter (Signed)
Spoke with patients husband and let him know that it had been approved. Thanks so much for your help with this.

## 2020-06-19 ENCOUNTER — Other Ambulatory Visit: Payer: Self-pay | Admitting: Cardiovascular Disease

## 2020-09-17 ENCOUNTER — Other Ambulatory Visit: Payer: Self-pay | Admitting: Cardiovascular Disease

## 2020-09-19 NOTE — Telephone Encounter (Signed)
Scheduled

## 2020-09-19 NOTE — Telephone Encounter (Signed)
Please contact pt for future f/u pt due for 12 month fu. Last seen 09/2020.

## 2020-09-19 NOTE — Telephone Encounter (Signed)
Please review and advise for pt alternative when refilling medication.  -RANEXA 500 MG 12 hr tablet [Pharmacy Med Name: RANEXA TABS 500MG ] is not on the preferred formulary for the patient's insurance plan. Below are alternatives which are likely to be more affordable. Do not assume that every medication presented is a clinically appropriate alternative.  These alternatives were suggested by the patient's pharmacy benefit manager.

## 2020-10-26 ENCOUNTER — Other Ambulatory Visit: Payer: Self-pay | Admitting: Cardiovascular Disease

## 2020-11-10 NOTE — Progress Notes (Signed)
Cardiology Office Note  Date:  11/14/2020   ID:  Emma Bauer, DOB Apr 04, 1966, MRN [id number]  PCP:  Junius Roads, MD   Chief Complaint  Patient presents with  . Other    12 month follow up. Patient c/o - Giving out. Meds reviewed verbally with patient.     HPI:  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,  remote DVT of the left leg after a long car trip,  cardiac catheterization in 2007 and 2016 for chest pain that showed noncritical CAD,  who presents for routine followup of her chest pain And leg swelling  Works in Conservation officer, historic buildings, [employer] nursing homes Stressful, Managing covid outbreaks  No exercise, but active at work Weight continues to run high  No Chest pain episode  on branded ranexa and imdur, sx stable  Feels generic lasix is causing swelling, worsening chest pain  Lab work reviewed Last labs requested Reports lipids higher HBA1C higher  EKG personally reviewed by myself on todays visit hows normal sinus rhythm rate 892bpm, LAD,  no significant ST or T-wave changes  Other past medical hx  cardiac catheterization 2016 showing no significant coronary artery disease Started on ranexa and low-dose isosorbide (1/2 dose) No further significant chest pain   PMH:   has a past medical history of Asthma, Coronary artery disease, non-occlusive, DM (diabetes mellitus) (HCC), DVT (deep venous thrombosis) (HCC), HLD (hyperlipidemia), HTN (hypertension), Hypertriglyceridemia, Hypothyroidism, Obesity, and Pseudotumor cerebri.  PSH:    Past Surgical History:  Procedure Laterality Date  . ablasion    . CARDIAC CATHETERIZATION  01/27/2015   [facility]  . CESAREAN SECTION     x3   . GALLBLADDER SURGERY    . lumbar paritineal shunt      Current Outpatient Medications  Medication Sig Dispense Refill  . aspirin 81 MG EC tablet Take 81 mg by mouth  daily.      Marland Kitchen b complex vitamins capsule Take 1 capsule by mouth daily.      . cetirizine (ZYRTEC) 10 MG tablet Take 10 mg by mouth daily.    . Cholecalciferol (VITAMIN D3) 125 MCG (5000 UT) TABS Take 1 tablet by mouth daily.    . citalopram (CELEXA) 20 MG tablet Take 20 mg by mouth daily. Take one and half tablet daily.    . Cyanocobalamin (VITAMIN B-12) 5000 MCG TBDP Take 5,000 mcg by mouth daily.    Tery Sanfilippo Calcium (STOOL SOFTENER PO) Take 2 tablets by mouth daily.    . furosemide (LASIX) 20 MG tablet Take 1 tablet (20 mg total) by mouth 2 (two) times daily as needed. 60 tablet 6  . isosorbide mononitrate (IMDUR) 30 MG 24 hr tablet Take 1 tablet (30 mg total) by mouth daily. 90 tablet 3  . JENTADUETO 2.5-500 MG TABS Take 1 tablet by mouth 2 (two) times daily.     . lansoprazole (PREVACID) 30 MG capsule Take 30 mg by mouth daily.   5  . magnesium gluconate (MAGONATE) 500 MG tablet Take 1,000 mg by mouth 2 (two) times daily.    . metFORMIN (GLUCOPHAGE) 500 MG tablet Take 1,000 mg by mouth daily.      . montelukast (SINGULAIR) 10 MG tablet Take 10 mg by mouth daily.     . Multiple Vitamin (MULTIVITAMIN) tablet Take 1 tablet by mouth daily.      Marland Kitchen  nebivolol (BYSTOLIC) 10 MG tablet TAKE 1 TABLET BY MOUTH EVERY DAY 90 tablet 0  . nitroGLYCERIN (NITROSTAT) 0.4 MG SL tablet Place 1 tablet (0.4 mg total) under the tongue every 5 (five) minutes as needed for chest pain. 25 tablet 6  . pregabalin (LYRICA) 75 MG capsule Take 75 mg by mouth 2 (two) times daily.     Marland Kitchen RANEXA 500 MG 12 hr tablet Take 2 tablets (1,000 mg total) by mouth 2 (two) times daily. Patient needs OV for future refills 60 tablet 0  . thyroid (ARMOUR THYROID) 180 MG tablet Take 180 mg by mouth daily.     Marland Kitchen zinc gluconate 50 MG tablet Take 50 mg by mouth daily.    Marland Kitchen zolmitriptan (ZOMIG) 5 MG tablet Take 5 mg by mouth as needed (headache).      No current facility-administered medications for this visit.     Allergies:    Penicillins   Social History:  The patient  reports that she has never smoked. She has never used smokeless tobacco. She reports that she does not drink alcohol and does not use drugs.   Family History:   family history includes CAD in her father, maternal aunt, maternal grandmother, and paternal aunt; Heart attack in her father; Heart failure in her brother.    Review of Systems: Review of Systems  Constitutional: Negative.   HENT: Negative.   Respiratory: Negative.   Cardiovascular: Positive for leg swelling.  Gastrointestinal: Negative.   Musculoskeletal: Negative.   Neurological: Negative.   Psychiatric/Behavioral: Negative.   All other systems reviewed and are negative.    PHYSICAL EXAM: VS:  BP 124/70 (BP Location: Left Arm, Patient Position: Sitting, Cuff Size: Large)   Pulse 92   Ht 5' (1.524 m)   Wt 253 lb (114.8 kg)   SpO2 98%   BMI 49.41 kg/m  , BMI Body mass index is 49.41 kg/m. Constitutional:  oriented to person, place, and time. No distress.  HENT:  Head: Grossly normal Eyes:  no discharge. No scleral icterus.  Neck: No JVD, no carotid bruits  Cardiovascular: Regular rate and rhythm, no murmurs appreciated Pulmonary/Chest: Clear to auscultation bilaterally, no wheezes or rails Abdominal: Soft.  no distension.  no tenderness.  Musculoskeletal: Normal range of motion Neurological:  normal muscle tone. Coordination normal. No atrophy Skin: Skin warm and dry Psychiatric: normal affect, pleasant  Recent Labs: 11/21/2019: BUN 15; Creatinine, Ser 0.68; Hemoglobin 11.4; Platelets 280; Potassium 4.4; Sodium 141    Lipid Panel Lab Results  Component Value Date   CHOL 155 01/06/2015   HDL 28 (L) 01/06/2015   LDLCALC 51 01/06/2015   TRIG 379 (H) 01/06/2015      Wt Readings from Last 3 Encounters:  11/14/20 253 lb (114.8 kg)  11/21/19 253 lb (114.8 kg)  09/15/19 254 lb (115.2 kg)      ASSESSMENT AND PLAN:  Coronary artery disease, non-occlusive  - Small vessel disease on prior cardiac catheterization No large vessel disease Reports symptoms well controlled on Imdur and Ranexa No flareup in symptoms despite significant stress at work Does branded medications not generic  Tachycardia - Doing well on beta-blocker, no changes made  HLD (hyperlipidemia)  We have requested lab work from primary care   Controlled type 2 diabetes mellitus without complication, without long-term current use of insulin (HCC) Reports numbers have been higher, we have requested this from primary care  Morbid obesity due to excess calories (HCC) Recommend walking program, she is going to  request an elliptical for Christmas    Total encounter time more than 25 minutes  Greater than 50% was spent in counseling and coordination of care with the patient    Orders Placed This Encounter  Procedures  . EKG 12-Lead     Signed, Dossie Arbour, M.D., Ph.D. 11/14/2020  [facility] [place], [place] [phone]

## 2020-11-14 ENCOUNTER — Other Ambulatory Visit: Payer: Self-pay

## 2020-11-14 ENCOUNTER — Telehealth: Payer: Self-pay

## 2020-11-14 ENCOUNTER — Encounter: Payer: Self-pay | Admitting: Cardiovascular Disease

## 2020-11-14 ENCOUNTER — Ambulatory Visit: Payer: 59 | Admitting: Cardiovascular Disease

## 2020-11-14 VITALS — BP 124/70 | HR 92 | Ht 60.0 in | Wt 253.0 lb

## 2020-11-14 DIAGNOSIS — I2510 Atherosclerotic heart disease of native coronary artery without angina pectoris: Secondary | ICD-10-CM

## 2020-11-14 DIAGNOSIS — R0789 Other chest pain: Secondary | ICD-10-CM

## 2020-11-14 DIAGNOSIS — E119 Type 2 diabetes mellitus without complications: Secondary | ICD-10-CM

## 2020-11-14 DIAGNOSIS — E782 Mixed hyperlipidemia: Secondary | ICD-10-CM

## 2020-11-14 MED ORDER — NEBIVOLOL HCL 10 MG PO TABS: ORAL_TABLET | ORAL | 3 refills | Status: DC

## 2020-11-14 MED ORDER — FUROSEMIDE 20 MG PO TABS: 20.0000 mg | ORAL_TABLET | Freq: Two times a day (BID) | ORAL | 1 refills | Status: DC | PRN

## 2020-11-14 MED ORDER — RANEXA 500 MG PO TB12: 1000.0000 mg | ORAL_TABLET | Freq: Two times a day (BID) | ORAL | 3 refills | Status: DC

## 2020-11-14 NOTE — Telephone Encounter (Signed)
Pt signed consent to have her most recent labs to be faxed to [facility] from her PCP, Jacinto Halim, NP with [facility].  Fax sent

## 2020-11-14 NOTE — Patient Instructions (Signed)
We will request your labs from PMD  Medication Instructions:  No changes  If you need a refill on your cardiac medications before your next appointment, please call your pharmacy.    Lab work: No new labs needed   If you have labs (blood work) drawn today and your tests are completely normal, you will receive your results only by: Marland Kitchen MyChart Message (if you have MyChart) OR . A paper copy in the mail If you have any lab test that is abnormal or we need to change your treatment, we will call you to review the results.   Testing/Procedures: No new testing needed   Follow-Up: At [facility], you and your health needs are our priority.  As part of our continuing mission to provide you with exceptional heart care, we have created designated Provider Care Teams.  These Care Teams include your primary Cardiologist (physician) and Advanced Practice Providers (APPs -  Physician Assistants and Nurse Practitioners) who all work together to provide you with the care you need, when you need it.  . You will need a follow up appointment in 12 months  . Providers on your designated Care Team:   . Nicolasa Ducking, NP . Eula Listen, PA-C . Marisue Ivan, PA-C  Any Other Special Instructions Will Be Listed Below (If Applicable).  COVID-19 Vaccine Information can be found at: [email or web address] For questions related to vaccine distribution or appointments, please email [email or web address] or call [phone].

## 2020-11-17 ENCOUNTER — Other Ambulatory Visit: Payer: Self-pay | Admitting: Cardiovascular Disease

## 2021-01-16 ENCOUNTER — Other Ambulatory Visit: Payer: Self-pay

## 2021-01-16 MED ORDER — RANEXA 500 MG PO TB12: 1000.0000 mg | ORAL_TABLET | Freq: Two times a day (BID) | ORAL | 3 refills | Status: DC

## 2021-01-16 NOTE — Telephone Encounter (Signed)
Dr. Mariah Milling patient,   Will forward to [facility] to review.  Jearld Adjutant, what are the alternatives? I can't see those in the note, but documentation states they are "listed below."

## 2021-01-16 NOTE — Telephone Encounter (Signed)
The options are given when responding to request from pharmacy medication refill.  *Ranolazine 1000 tablet is preferred alternative.

## 2021-01-16 NOTE — Telephone Encounter (Signed)
*  STAT* If patient is at the pharmacy, call can be transferred to refill team.   1. Which medications need to be refilled? (please list name of each medication and dose if known) Ranexa  2. Which pharmacy/location (including street and city if local pharmacy) is medication to be sent to? [facility]  3. Do they need a 30 day or 90 day supply? 90  Patient states prescriptions has to read, no generic.

## 2021-01-16 NOTE — Telephone Encounter (Signed)
Spoke with tp, pt can only take Brand name, not formulary alternatives. Reports each year has to be re-approved to have refilled under the name Ranexa. Medication given 90 day supply with 3 refills. [facility]

## 2021-01-18 ENCOUNTER — Telehealth: Payer: Self-pay

## 2021-01-18 ENCOUNTER — Telehealth: Payer: Self-pay | Admitting: Cardiovascular Disease

## 2021-01-18 NOTE — Telephone Encounter (Signed)
Fax received from covermymeds initiating prior authorization for brand name Ranexa. KEY: [id number]  Questions answered in [email or web address] and chart notes included from 09/15/2019 office visit stating why brand name is being requested.  RESPONSE:  [facility] is reviewing your PA request and will respond within 24 hours for Medicaid or up to 72 hours for non-Medicaid plans, based on the required timeframe determined by state or federal regulations. To check for an update later, open this request from your dashboard.

## 2021-01-18 NOTE — Telephone Encounter (Signed)
[facility]ed [facility] regarding pt's mediation of Ranexa. Pt cannot take the generic of Ranexa, must take brand name so she does not experience the side affects of medication. Spoke with Rodman Pickle, Pharmacologist. Reference # [id number]. Prior authorization approved over the phone at this time, pt is cover for one year with 90 day supply, 3 refills.   Ranexa 500 mg 1000 mg (two tabs) BID

## 2021-01-18 NOTE — Telephone Encounter (Signed)
Pt c/o medication issue:  1. Name of Medication:  Ranexa 1000 mg po BID   2. How are you currently taking this medication (dosage and times per day)? new  3. Are you having a reaction (difficulty breathing--STAT)? no  4. What is your medication issue? Per pharmacy not covered please call to discuss another med that is covered . Per pharmacy not preferred medication and are suggesting ranolazine ER 500   Please call [phone]  Invoice # [id number]  If wanting to stay with Ranexa will need Prior Auth

## 2021-01-23 ENCOUNTER — Telehealth: Payer: Self-pay

## 2021-01-23 NOTE — Telephone Encounter (Signed)
Faxed signed form with approval for pt to take brand name Renexa ER which is not on her formulary list. Pt needs brand name.

## 2021-02-14 NOTE — Telephone Encounter (Signed)
Prior Authorization for Ranexa-Brand name only denied. Call attempted to speak with [facility] benefit coverage review department. [phone]. Was transferred four times and sat on hold for greater than one hour. None of the representatives I have spoken with have ben able to confirm whether or not they have received the exeption form faxed by Emma Mares, RN on 01/23/2021. Sent appeal through [email or web address] with Dr. Windell Hummingbird 09/15/2019 office note attached as well as reasoning for requesting brand name drug. Case ID# [id number]  After being on hold for over one hour I was able to speak with Emma Bauer specialist, who confirmed he did receive the 01/23/2021 form faxed from [facility] which was still considered part of the prior authorization process. Medication was denied at that point because it still did not meet their criteria since they could not determine what type or the severity of the reaction she had. He states the appeal I submitted today with the office note is now under a more in depth review of the office note to determine this information.  I also requested long-term approval for this medication since she is unable to tolerate the generic form however he states the company will not extend it to longer than one year due to plan changes.  Review has been escalated to urgent and will be reviewed within 72 hours. Our office will be contacted with the determination.

## 2021-02-14 NOTE — Telephone Encounter (Signed)
Patient spouse calling  States that they have never received Ranexa medication from [facility] and they are saying that they never received approval from our office  Requesting for it to be resent  Please call to discuss

## 2021-02-15 ENCOUNTER — Telehealth: Payer: Self-pay | Admitting: Cardiovascular Disease

## 2021-02-15 NOTE — Telephone Encounter (Signed)
Returned the patients call. Lmom. That Dr. Windell Hummingbird nurse Kem Kays, RN is out of the office today and will return tomorrow. It appears that she has been working on getting an approval for the patient name brand Ranexa. I will fwd the msg to [facility] to f/u with the patient when she returns. If the pt has questions or concerns in the interim she is to contact the office.

## 2021-02-15 NOTE — Telephone Encounter (Signed)
Please call regarding Ranexa authorization.  Patient did not want to give all the information but one time.

## 2021-02-15 NOTE — Telephone Encounter (Signed)
Patient spouse calling  States that [facility] will be sending a new form to be completed  Asked that we review the form from last year when filling out this one  Any questions, please call spouse Kathlene November at [phone]

## 2021-02-16 ENCOUNTER — Telehealth: Payer: Self-pay

## 2021-02-16 NOTE — Telephone Encounter (Signed)
Called Mrs. Dortch to let her know her Ranexa has been approved for brand name medication and good until March of 2023. Mrs. Biscoe is very delighted with the good news and very thankful. Advised if anymore trouble or concerns then please reach back out. Mrs. Fritsche verbalized understanding, nothing further at this time.   Ranexa approval for 12/24/2020-03/10/20232023 Patient ID: [id number] Case ID: [id number] Ranexa 500 mg TAB ER 12H

## 2021-02-16 NOTE — Telephone Encounter (Signed)
Was able to call Mrs. Emma Bauer this morning to give her an update regarding her Ranexa, gave sincere apologies d/t her insurance giving her a hard time this year with not wanting to cover name brand of this medication, multiple telephone calls and faxes have been sent trying to get approval. At this time, waiting for denial to be over turned, [facility] stated it could take 72 hours for a response. Advised Mrs. Emma Bauer will call back soon as we get an answer. Mrs. Emma Bauer verbalized understanding and will await call back.

## 2021-02-16 NOTE — Telephone Encounter (Signed)
Another fax sent to [facility] to appeal denial for pts' brand name Emma Bauer. Reason for why she cannot take generic of Emma Bauer provided along with Dr. Windell Hummingbird progress notes of why generic is not suitable for Emma Bauer. Generic forms of Emma Bauer causes severe CP, SHOB, and lower leg edema to where she lands in the ED for emergency treatment.   Case ID : [id number]

## 2021-04-27 ENCOUNTER — Other Ambulatory Visit: Payer: Self-pay | Admitting: Cardiovascular Disease

## 2021-05-07 ENCOUNTER — Emergency Department: Payer: 59

## 2021-05-07 ENCOUNTER — Encounter: Payer: Self-pay | Admitting: Intensive Care

## 2021-05-07 ENCOUNTER — Emergency Department
Admission: EM | Admit: 2021-05-07 | Discharge: 2021-05-07 | Disposition: A | Discharge status: Home / Self Care | Payer: 59 | Attending: Emergency Medicine | Admitting: Emergency Medicine

## 2021-05-07 ENCOUNTER — Other Ambulatory Visit: Payer: Self-pay

## 2021-05-07 DIAGNOSIS — Z79899 Other long term (current) drug therapy: Secondary | ICD-10-CM | POA: Diagnosis not present

## 2021-05-07 DIAGNOSIS — I2510 Atherosclerotic heart disease of native coronary artery without angina pectoris: Secondary | ICD-10-CM | POA: Diagnosis not present

## 2021-05-07 DIAGNOSIS — U071 COVID-19: Secondary | ICD-10-CM | POA: Diagnosis not present

## 2021-05-07 DIAGNOSIS — I10 Essential (primary) hypertension: Secondary | ICD-10-CM | POA: Insufficient documentation

## 2021-05-07 DIAGNOSIS — E039 Hypothyroidism, unspecified: Secondary | ICD-10-CM | POA: Diagnosis not present

## 2021-05-07 DIAGNOSIS — J45909 Unspecified asthma, uncomplicated: Secondary | ICD-10-CM | POA: Diagnosis not present

## 2021-05-07 DIAGNOSIS — E119 Type 2 diabetes mellitus without complications: Secondary | ICD-10-CM | POA: Diagnosis not present

## 2021-05-07 DIAGNOSIS — Z7984 Long term (current) use of oral hypoglycemic drugs: Secondary | ICD-10-CM | POA: Diagnosis not present

## 2021-05-07 DIAGNOSIS — J45901 Unspecified asthma with (acute) exacerbation: Secondary | ICD-10-CM

## 2021-05-07 DIAGNOSIS — Z7982 Long term (current) use of aspirin: Secondary | ICD-10-CM | POA: Insufficient documentation

## 2021-05-07 DIAGNOSIS — R0602 Shortness of breath: Secondary | ICD-10-CM | POA: Diagnosis present

## 2021-05-07 LAB — COMPREHENSIVE METABOLIC PANEL
ALT: 18 U/L (ref 0–44)
AST: 18 U/L (ref 15–41)
Albumin: 3.6 g/dL (ref 3.5–5.0)
Alkaline Phosphatase: 64 U/L (ref 38–126)
Anion gap: 11 (ref 5–15)
BUN: 15 mg/dL (ref 6–20)
CO2: 24 mmol/L (ref 22–32)
Calcium: 8.7 mg/dL — ABNORMAL LOW (ref 8.9–10.3)
Chloride: 102 mmol/L (ref 98–111)
Creatinine, Ser: 0.68 mg/dL (ref 0.44–1.00)
GFR, Estimated: >60 mL/min (ref >60)
Glucose, Bld: 201 mg/dL — ABNORMAL HIGH (ref 70–99)
Potassium: 3.8 mmol/L (ref 3.5–5.1)
Sodium: 137 mmol/L (ref 135–145)
Total Bilirubin: 0.4 mg/dL (ref 0.3–1.2)
Total Protein: 6.4 g/dL — ABNORMAL LOW (ref 6.5–8.1)

## 2021-05-07 LAB — CBC WITH DIFFERENTIAL/PLATELET
Abs Immature Granulocytes: 0.05 10*3/uL (ref 0.00–0.07)
Basophils Absolute: 0 10*3/uL (ref 0.0–0.1)
Basophils Relative: 0 %
Eosinophils Absolute: 0.1 10*3/uL (ref 0.0–0.5)
Eosinophils Relative: 1 %
HCT: 36.4 % (ref 36.0–46.0)
Hemoglobin: 11.6 g/dL — ABNORMAL LOW (ref 12.0–15.0)
Immature Granulocytes: 1 %
Lymphocytes Relative: 24 %
Lymphs Abs: 2.1 10*3/uL (ref 0.7–4.0)
MCH: 25.9 pg — ABNORMAL LOW (ref 26.0–34.0)
MCHC: 31.9 g/dL (ref 30.0–36.0)
MCV: 81.3 fL (ref 80.0–100.0)
Monocytes Absolute: 1.2 10*3/uL — ABNORMAL HIGH (ref 0.1–1.0)
Monocytes Relative: 14 %
Neutro Abs: 5.3 10*3/uL (ref 1.7–7.7)
Neutrophils Relative %: 60 %
Platelets: 193 10*3/uL (ref 150–400)
RBC: 4.48 MIL/uL (ref 3.87–5.11)
RDW: 18.9 % — ABNORMAL HIGH (ref 11.5–15.5)
WBC: 8.7 10*3/uL (ref 4.0–10.5)
nRBC: 0 % (ref 0.0–0.2)

## 2021-05-07 LAB — TROPONIN I (HIGH SENSITIVITY): Troponin I (High Sensitivity): 3 ng/L (ref <18)

## 2021-05-07 LAB — MAGNESIUM: Magnesium: 1.9 mg/dL (ref 1.7–2.4)

## 2021-05-07 LAB — D-DIMER, QUANTITATIVE: D-Dimer, Quant: 0.5 ug/mL-FEU (ref 0.00–0.50)

## 2021-05-07 LAB — PROCALCITONIN: Procalcitonin: <0.1 ng/mL

## 2021-05-07 MED ORDER — IPRATROPIUM-ALBUTEROL 0.5-2.5 (3) MG/3ML IN SOLN: 3.0000 mL | Freq: Once | RESPIRATORY_TRACT | Status: DC

## 2021-05-07 MED ORDER — NIRMATRELVIR/RITONAVIR (PAXLOVID) TABLET (RENAL DOSING): 2.0000 | ORAL_TABLET | Freq: Two times a day (BID) | ORAL | 0 refills | Status: DC

## 2021-05-07 MED ORDER — ALBUTEROL SULFATE HFA 108 (90 BASE) MCG/ACT IN AERS
2.0000 | INHALATION_SPRAY | Freq: Four times a day (QID) | RESPIRATORY_TRACT | Status: DC | PRN
  Administered 2021-05-07: 2 via RESPIRATORY_TRACT
  Filled 2021-05-07: qty 6.7

## 2021-05-07 MED ORDER — ALBUTEROL SULFATE (2.5 MG/3ML) 0.083% IN NEBU: 2.5000 mg | INHALATION_SOLUTION | Freq: Once | RESPIRATORY_TRACT | Status: DC

## 2021-05-07 MED ORDER — PREDNISONE 10 MG PO TABS: 40.0000 mg | ORAL_TABLET | Freq: Every day | ORAL | 0 refills | Status: DC

## 2021-05-07 MED ORDER — ALBUTEROL SULFATE HFA 108 (90 BASE) MCG/ACT IN AERS: 2.0000 | INHALATION_SPRAY | Freq: Once | RESPIRATORY_TRACT | Status: DC

## 2021-05-07 MED ORDER — METHYLPREDNISOLONE SODIUM SUCC 125 MG IJ SOLR
125.0000 mg | Freq: Once | INTRAMUSCULAR | Status: AC
  Administered 2021-05-07: 125 mg via INTRAVENOUS
  Filled 2021-05-07: qty 2

## 2021-05-07 NOTE — ED Provider Notes (Signed)
[facility] Emergency Department Provider Note  ____________________________________________   Event Date/Time   First MD Initiated Contact with Patient 05/07/21 1846     (approximate)  I have reviewed the triage vital signs and the nursing notes.   HISTORY  Chief Complaint Chest Pain, Cough, and Covid Positive   HPI Emma Bauer is a 55 y.o. female with a past medical history of CAD, DM, remote DVT Emma currently on anticoagulation, HTN, HDL, hypothyroidism, obesity, and asthma who presents for assessment of cough, shortness of breath, pleuritic chest pain, headaches and fevers, body aches and fatigue she states started yesterday.  She states she had a positive home COVID test yesterday.  She denies any vomiting, diarrhea, urinary symptoms, rash or recent falls or injuries.  No vision changes, vertigo or chest pain except when she was taking deep breaths and coughing.  No other acute symptoms at this time.         Past Medical History:  Diagnosis Date  . Asthma   . Coronary artery disease, non-occlusive    a. 2007, non obstructive, symptoms felt to be 2/2 vasospasm   . DM (diabetes mellitus) (HCC)    borderline  . DVT (deep venous thrombosis) (HCC)    L leg 2/2 long car trip  . HLD (hyperlipidemia)   . HTN (hypertension)   . Hypertriglyceridemia   . Hypothyroidism   . Obesity   . Pseudotumor cerebri    lumbar to peritoneal shunt    Patient Active Problem List   Diagnosis Date Noted  . Coronary artery disease, non-occlusive   . Hypertriglyceridemia   . Asthma   . Tachycardia 08/19/2012  . Edema 07/12/2011  . Diabetes type 2, controlled (HCC) 01/17/2010  . CAD 01/17/2010  . CHEST DISCOMFORT 01/17/2010  . DEEP VENOUS THROMBOPHLEBITIS, LEFT, LEG, HX OF 01/17/2010  . HYPOTHYROIDISM 01/12/2010  . HLD (hyperlipidemia) 01/12/2010  . OBESITY-MORBID (>100') 01/12/2010  . PRE-DIABETES 01/12/2010  . Atypical chest pain 01/12/2010    Past  Surgical History:  Procedure Laterality Date  . ablasion    . CARDIAC CATHETERIZATION  01/27/2015   [facility]  . CESAREAN SECTION     x3   . GALLBLADDER SURGERY    . lumbar paritineal shunt      Prior to Admission medications   Medication Sig Start Date End Date Taking? Authorizing Provider  nirmatrelvir/ritonavir EUA, renal dosing, (PAXLOVID) TABS Take 2 tablets by mouth 2 (two) times daily for 5 days. Patient GFR is >60. Take nirmatrelvir (150 mg) one tablet twice daily for 5 days and ritonavir (100 mg) one tablet twice daily for 5 days. 05/07/21 05/12/21 Yes Gilles Chiquito, MD  predniSONE (DELTASONE) 10 MG tablet Take 4 tablets (40 mg total) by mouth daily for 4 days. 05/07/21 05/11/21 Yes Gilles Chiquito, MD  aspirin 81 MG EC tablet Take 81 mg by mouth daily.      [provider]  b complex vitamins capsule Take 1 capsule by mouth daily.      [provider]  cetirizine (ZYRTEC) 10 MG tablet Take 10 mg by mouth daily.    [provider]  Cholecalciferol (VITAMIN D3) 125 MCG (5000 UT) TABS Take 1 tablet by mouth daily.    [provider]  citalopram (CELEXA) 20 MG tablet Take 20 mg by mouth daily. Take one and half tablet daily.    [provider]  Cyanocobalamin (VITAMIN B-12) 5000 MCG TBDP Take 5,000 mcg by mouth daily.  [provider]  Docusate Calcium (STOOL SOFTENER PO) Take 2 tablets by mouth daily.    [provider]  furosemide (LASIX) 20 MG tablet Take 1 tablet (20 mg total) by mouth 2 (two) times daily as needed. 11/14/20   Antonieta Iba, MD  isosorbide mononitrate (IMDUR) 30 MG 24 hr tablet TAKE 1 TABLET(30 MG) BY MOUTH DAILY 04/27/21   Gollan, Tollie Pizza, MD  JENTADUETO 2.5-500 MG TABS Take 1 tablet by mouth 2 (two) times daily.  07/27/17   [provider]  lansoprazole (PREVACID) 30 MG capsule Take 30 mg by mouth daily.  08/03/17   [provider]  magnesium gluconate (MAGONATE) 500 MG tablet Take  1,000 mg by mouth 2 (two) times daily.    [provider]  metFORMIN (GLUCOPHAGE) 500 MG tablet Take 1,000 mg by mouth daily.      [provider]  montelukast (SINGULAIR) 10 MG tablet Take 10 mg by mouth daily.     [provider]  Multiple Vitamin (MULTIVITAMIN) tablet Take 1 tablet by mouth daily.      [provider]  nebivolol (BYSTOLIC) 10 MG tablet TAKE 1 TABLET BY MOUTH EVERY DAY 11/14/20   Gollan, Tollie Pizza, MD  nitroGLYCERIN (NITROSTAT) 0.4 MG SL tablet Place 1 tablet (0.4 mg total) under the tongue every 5 (five) minutes as needed for chest pain. 01/06/15   Antonieta Iba, MD  pregabalin (LYRICA) 75 MG capsule Take 75 mg by mouth 2 (two) times daily.     [provider]  RANEXA 500 MG 12 hr tablet Take 2 tablets (1,000 mg total) by mouth 2 (two) times daily. Branded only 01/16/21   Antonieta Iba, MD  thyroid [facility] THYROID) 180 MG tablet Take 180 mg by mouth daily.     [provider]  zinc gluconate 50 MG tablet Take 50 mg by mouth daily.    [provider]  zolmitriptan (ZOMIG) 5 MG tablet Take 5 mg by mouth as needed (headache).     [provider]    Allergies Penicillins  Family History  Problem Relation Age of Onset  . CAD Father   . Heart attack Father        MI x 3 (age of first MI unknown, 2nd MI at age 31, age of 3rd MI 59-->which unfortunately caused his death)  . Heart failure Brother   . CAD Maternal Grandmother   . CAD Maternal Aunt   . CAD Paternal Aunt     Social History Social History   Tobacco Use  . Smoking status: Never Smoker  . Smokeless tobacco: Never Used  . Tobacco comment: tobacco use - no  Substance Use Topics  . Alcohol use: No  . Drug use: No    Review of Systems  Review of Systems  Constitutional: Positive for chills, fever and malaise/fatigue.  HENT: Negative for sore throat.   Eyes: Negative for pain.  Respiratory: Positive for cough, shortness of breath  and wheezing. Negative for stridor.   Cardiovascular: Positive for chest pain.  Gastrointestinal: Negative for abdominal pain, diarrhea and vomiting.  Genitourinary: Negative for dysuria.  Musculoskeletal: Positive for myalgias.  Skin: Negative for rash.  Neurological: Negative for seizures, loss of consciousness and headaches.  Psychiatric/Behavioral: Negative for suicidal ideas.  All other systems reviewed and are negative.     ____________________________________________   PHYSICAL EXAM:  VITAL SIGNS: ED Triage Vitals  Enc Vitals Group     BP  Pulse      Resp      Temp      Temp src      SpO2      Weight      Height      Head Circumference      Peak Flow      Pain Score      Pain Loc      Pain Edu?      Excl. in GC?    Vitals:   05/07/21 2000 05/07/21 2030  BP: 108/67 99/64  Pulse: 99 91  Resp: 20 19  Temp:    SpO2: 96% 96%   Physical Exam Vitals and nursing note reviewed.  Constitutional:      General: She is Emma in acute distress.    Appearance: She is well-developed. She is obese. She is ill-appearing.  HENT:     Head: Normocephalic and atraumatic.     Right Ear: External ear normal.     Left Ear: External ear normal.     Nose: Nose normal.  Eyes:     Conjunctiva/sclera: Conjunctivae normal.  Cardiovascular:     Rate and Rhythm: Normal rate and regular rhythm.     Heart sounds: No murmur heard.   Pulmonary:     Effort: Tachypnea present.     Breath sounds: Decreased breath sounds and wheezing present.  Abdominal:     Palpations: Abdomen is soft.     Tenderness: There is no abdominal tenderness.  Musculoskeletal:     Cervical back: Neck supple.  Skin:    General: Skin is warm and dry.     Capillary Refill: Capillary refill takes less than 2 seconds.  Neurological:     Mental Status: She is alert and oriented to person, place, and time.  Psychiatric:        Mood and Affect: Mood normal.       ____________________________________________   LABS (all labs ordered are listed, but only abnormal results are displayed)  Labs Reviewed  CBC WITH DIFFERENTIAL/PLATELET - Abnormal; Notable for the following components:      Result Value   Hemoglobin 11.6 (*)    MCH 25.9 (*)    RDW 18.9 (*)    Monocytes Absolute 1.2 (*)    All other components within normal limits  COMPREHENSIVE METABOLIC PANEL - Abnormal; Notable for the following components:   Glucose, Bld 201 (*)    Calcium 8.7 (*)    Total Protein 6.4 (*)    All other components within normal limits  MAGNESIUM  D-DIMER, QUANTITATIVE  PROCALCITONIN  TROPONIN I (HIGH SENSITIVITY)   ____________________________________________  EKG  Sinus tachycardia with ventricular to 103, left axis deviation, artifact versus nonspecific change in anterior leads with unremarkable intervals and no other clear evidence of acute ischemia. ____________________________________________  RADIOLOGY  ED MD interpretation: Some mild thickening of peribronchial areas without clear focal consolidation, large effusion, pneumothorax, overt edema or other clear acute intrathoracic process.   Official radiology report(s): DG Chest Portable 1 View  Result Date: 05/07/2021 CLINICAL DATA:  COVID-19 positive. EXAM: PORTABLE CHEST 1 VIEW COMPARISON:  September 08, 2019 FINDINGS: Cardiomediastinal silhouette is normal. Mediastinal contours appear intact. There is no evidence of lobar airspace consolidation, pleural effusion or pneumothorax. Mild thickening of the peribronchial markings in the lower lobes. Osseous structures are without acute abnormality. Soft tissues are grossly normal. IMPRESSION: Mild thickening of the peribronchial markings in the lower lobes. Electronically Signed   By: Ulanda Edison.D.  On: 05/07/2021 19:24    ____________________________________________   PROCEDURES  Procedure(s) performed (including Critical  Care):  .1-3 Lead EKG Interpretation Performed by: Gilles ChiquitoSmith, Genine Beckett P, MD Authorized by: Gilles ChiquitoSmith, Keonna Raether P, MD     Interpretation: normal     ECG rate assessment: normal     Rhythm: sinus rhythm     Ectopy: none     Conduction: normal       ____________________________________________   INITIAL IMPRESSION / ASSESSMENT AND PLAN / ED COURSE      Patient presents with above-stated history exam for assessment of pleuritic chest pain, cough, shortness of breath, headache, allergies, fevers and fatigue after being diagnosed with COVID yesterday at home test.  On arrival she is tachypneic with otherwise stable vital signs on room air although her heart rate on the high exam was noted to intermittently increase up to 110.  Differential includes symptoms secondary to acute COVID infection possibly complicated by metabolic derangements, anemia, ACS, PE, acute bacterial pneumonia, arrhythmia and exacerbation of underlying obstructive airway disease.  Given wheezing will treat with albuterol and steroids.  Chest x-ray without focal consolidation or evidence of edema effusion or pneumothorax or other clear acute intrathoracic process.  Given absence of leukocytosis I have a low suspicion for acute bacterial superinfection.  CBC without leukocytosis or acute anemia as hemoglobin of 11.6 compared to 11.41-year ago.  CMP with a glucose of 200 any other significant electrolyte or metabolic derangements.  No evidence of acidosis or DKA.  Troponin is nonelevated at 3 and given this was obtained greater than 3 hours after symptom onset I have a low suspicion for ACS or myocarditis D-dimer is within normal limits and thus I have a low suspicion for PE at this time.  Magnesium is WNL.  After receiving breathing treatment and Solu-Medrol patient was ambulated in her room and was able to maintain SPO2 greater than 96%.  Given stable vitals with otherwise reassuring exam work-up I suspect symptoms are likely  related to her COVID infection.  Will give course of Paxil but given appropriate GFR.  Also give a short course of prednisone and advised patient to continue taking albuterol for his mild struct of airway exacerbation.  Discharged in stable condition.  Strict return precautions advised and discussed     ____________________________________________   FINAL CLINICAL IMPRESSION(S) / ED DIAGNOSES  Final diagnoses:  COVID    Medications  albuterol (VENTOLIN HFA) 108 (90 Base) MCG/ACT inhaler 2 puff (2 puffs Inhalation Given 05/07/21 2106)  methylPREDNISolone sodium succinate (SOLU-MEDROL) 125 mg/2 mL injection 125 mg (125 mg Intravenous Given 05/07/21 1926)     ED Discharge Orders         Ordered    predniSONE (DELTASONE) 10 MG tablet  Daily        05/07/21 2120    nirmatrelvir/ritonavir EUA, renal dosing, (PAXLOVID) TABS  2 times daily        05/07/21 2120           Note:  This document was prepared using Dragon voice recognition software and may include unintentional dictation errors.   Gilles ChiquitoSmith, Jerrell Hart P, MD 05/07/21 2123

## 2021-05-07 NOTE — ED Notes (Signed)
Patient ambulated in ED room, oxygen saturation 96% on room air with ambulation.

## 2021-05-07 NOTE — ED Triage Notes (Signed)
Patient tested positive for covid on home test Saturday. C/o chest pains, cough, worsening body aches, and fever. HX asthma. A&O x4 upon arrival

## 2021-05-08 ENCOUNTER — Telehealth: Payer: Self-pay | Admitting: Emergency Medicine

## 2021-05-08 MED ORDER — PREDNISONE 50 MG PO TABS: 50.0000 mg | ORAL_TABLET | Freq: Every day | ORAL | 0 refills | Status: DC

## 2021-05-08 MED ORDER — NIRMATRELVIR/RITONAVIR (PAXLOVID)TABLET: 3.0000 | ORAL_TABLET | Freq: Two times a day (BID) | ORAL | 0 refills | Status: AC

## 2021-05-08 NOTE — Telephone Encounter (Signed)
Patient's pharmacy does not have paxlovid, re sent to different pharmacy

## 2021-06-11 IMAGING — DX DG CHEST 1V PORT
1 series · 1 of 1 positions shown · non-contrast
Comparison: September 08, 2019

CLINICAL DATA: [id number] positive.

EXAM:
PORTABLE CHEST 1 VIEW

[chest ap]
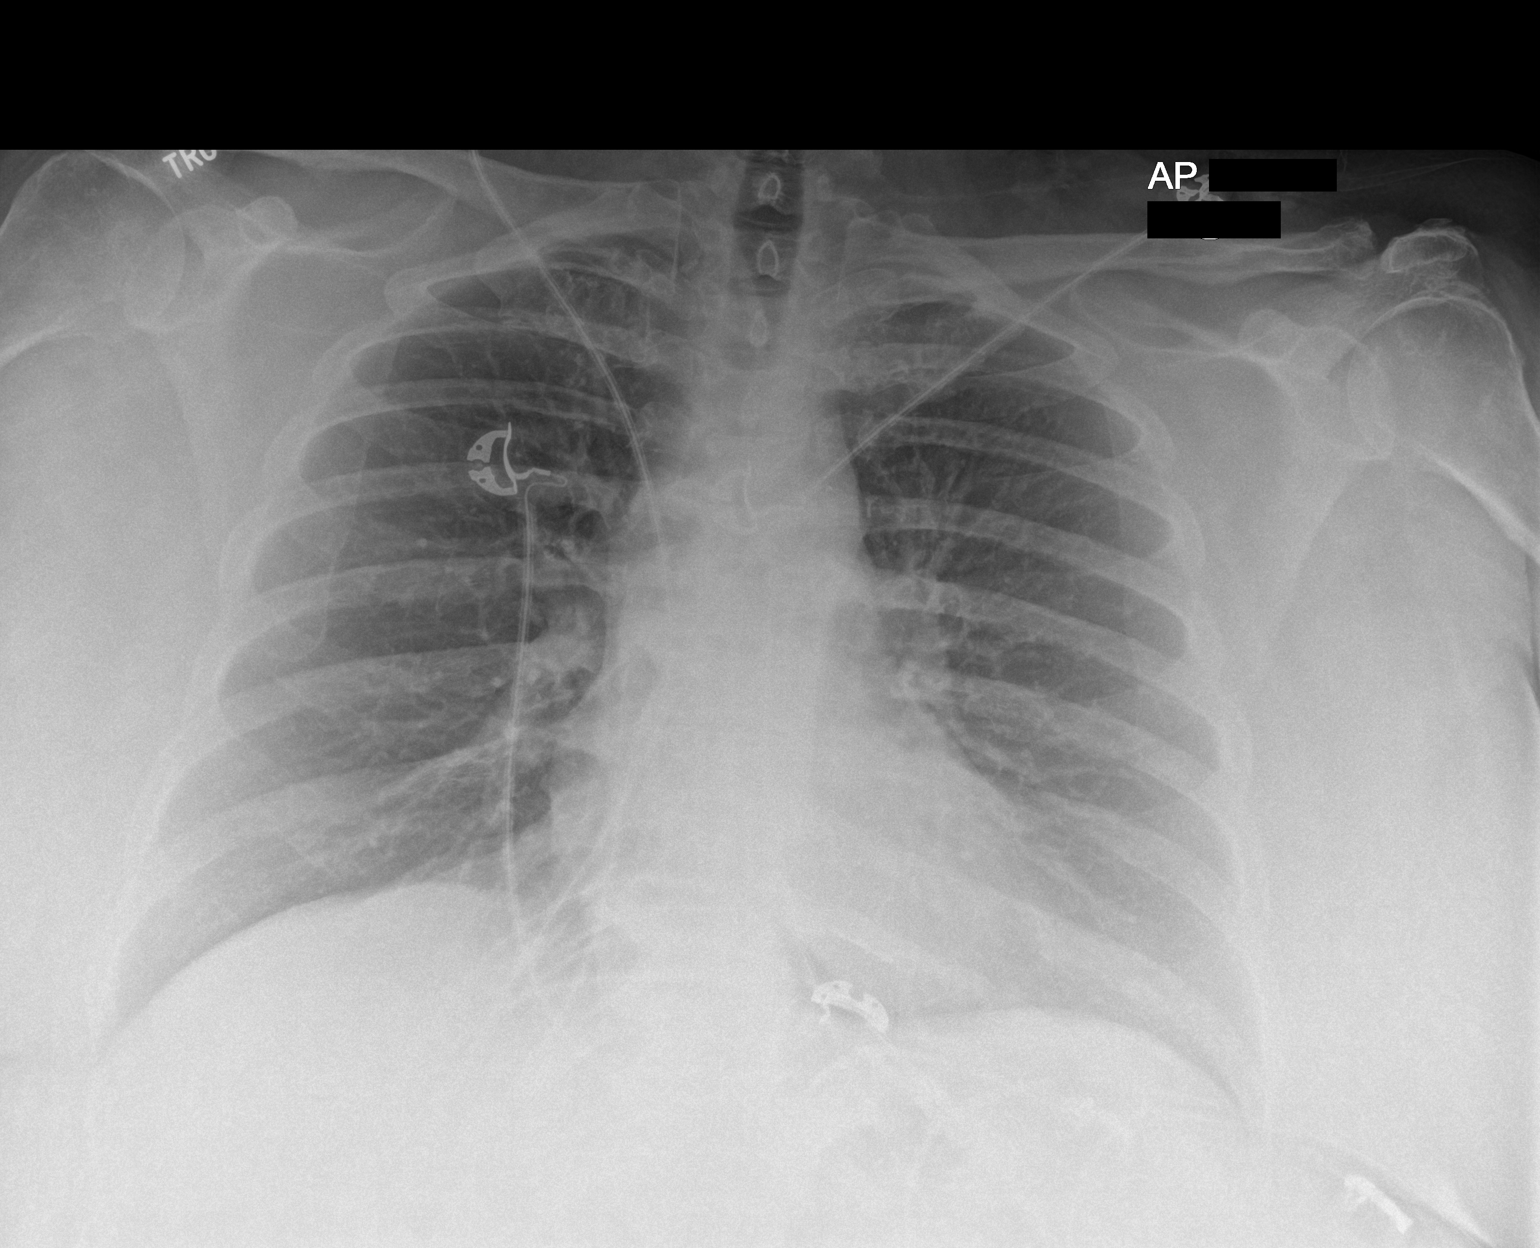

[1 of 1 positions shown; findings below may reference images not displayed]

FINDINGS: Cardiomediastinal silhouette is normal. Mediastinal contours appear
intact.

There is no evidence of lobar airspace consolidation, pleural
effusion or pneumothorax. Mild thickening of the peribronchial
markings in the lower lobes.

Osseous structures are without acute abnormality. Soft tissues are
grossly normal.
IMPRESSION: Mild thickening of the peribronchial markings in the lower lobes.

## 2021-11-20 ENCOUNTER — Other Ambulatory Visit: Payer: Self-pay | Admitting: Cardiovascular Disease

## 2021-11-20 NOTE — Telephone Encounter (Signed)
Please contact pt for future appointment. Pt due for 12 month f/u. 

## 2021-11-20 NOTE — Telephone Encounter (Signed)
Scheduled

## 2021-12-18 NOTE — Progress Notes (Signed)
Cardiology Office Note  Date:  12/20/2021   ID:  [name], DOB Jun 27, 1966, MRN [id number]  PCP:  Junius RoadsHolt, James B, MD   Chief Complaint  Patient presents with   12 month follow up     Patient c/o shortness of breath with over exertion. Medications reviewed by the patient verbally.     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, [employer]  Some SOB with exertion Visit work but no regular exercise program  Weight continues to run high  No Chest pain episode  on branded ranexa and imdur, sx stable  Lab work reviewed Last labs requested Reports lipids higher HBA1C higher  EKG personally reviewed by myself on todays visit hows normal sinus rhythm rate 77 bpm, LAD,  no significant ST or T-wave changes   Other past medical hx  cardiac catheterization 2016 showing no significant coronary artery disease Started on ranexa and low-dose isosorbide (1/2 dose) No further significant chest pain   PMH:   has a past medical history of Asthma, Coronary artery disease, non-occlusive, DM (diabetes mellitus) (HCC), DVT (deep venous thrombosis) (HCC), HLD (hyperlipidemia), HTN (hypertension), Hypertriglyceridemia, Hypothyroidism, Obesity, and Pseudotumor cerebri.  PSH:    Past Surgical History:  Procedure Laterality Date   ablasion     CARDIAC CATHETERIZATION  01/27/2015   [facility]   CESAREAN SECTION     x3    GALLBLADDER SURGERY     lumbar paritineal shunt      Current Outpatient Medications  Medication Sig Dispense Refill   albuterol  (PROVENTIL) (2.5 MG/3ML) 0.083% nebulizer solution Inhale into the lungs.     albuterol (VENTOLIN HFA) 108 (90 Base) MCG/ACT inhaler Inhale into the lungs.     azithromycin (ZITHROMAX) 250 MG tablet TK 2 TS PO ON DAY 1, THEN TK 1 T PO D FOR 4 DAYS     budesonide-formoterol (SYMBICORT) 160-4.5 MCG/ACT inhaler Symbicort 160 mcg-4.5 mcg/actuation HFA aerosol inhaler  INL 2 PFS PO BID     cetirizine (ZYRTEC) 10 MG tablet Take 10 mg by mouth daily.     Cholecalciferol (VITAMIN D3) 125 MCG (5000 UT) TABS Take 1 tablet by mouth daily.     Cholecalciferol 125 MCG (5000 UT) TABS Take by mouth.     citalopram (CELEXA) 20 MG tablet Take 20 mg by mouth daily. Take one and half tablet daily.     fenofibrate 160 MG tablet Take 160 mg by mouth daily.     lansoprazole (PREVACID) 30 MG capsule Take 30 mg by mouth daily.   5   magnesium gluconate (MAGONATE) 500 MG tablet Take 500 mg by mouth 2 (two) times daily.     metFORMIN (GLUCOPHAGE-XR) 500 MG 24 hr tablet Take 500 mg by mouth daily with breakfast.     montelukast (SINGULAIR) 10 MG tablet Take 10 mg by mouth daily.      nitroGLYCERIN (NITROSTAT) 0.4 MG SL tablet Place 1 tablet (0.4 mg total) under the tongue every 5 (five) minutes as needed for chest pain.  25 tablet 6   OZEMPIC, 0.25 OR 0.5 MG/DOSE, 2 MG/1.5ML SOPN Inject 0.5 mg into the skin once a week.     pregabalin (LYRICA) 75 MG capsule Take 75 mg by mouth 2 (two) times daily.      RANEXA 500 MG 12 hr tablet Take 2 tablets (1,000 mg total) by mouth 2 (two) times daily. Branded only 360 tablet 3   thyroid (ARMOUR) 180 MG tablet Take 180 mg by mouth daily.      zolmitriptan (ZOMIG) 5 MG tablet Take 5 mg by mouth as needed (headache).      atorvastatin (LIPITOR) 10 MG tablet Take 1 tablet (10 mg total) by mouth daily. 90 tablet 3   furosemide (LASIX) 20 MG tablet Take 1 tablet (20 mg total) by mouth 2 (two) times daily as needed. 60 tablet 3   isosorbide mononitrate (IMDUR) 30 MG 24 hr tablet Take 1  tablet (30 mg total) by mouth daily. 90 tablet 3   metFORMIN (GLUCOPHAGE) 500 MG tablet Take 1,000 mg by mouth daily.   (Patient not taking: Reported on 12/19/2021)     nebivolol (BYSTOLIC) 10 MG tablet Take 1 tablet (10 mg total) by mouth daily. 90 tablet 3   No current facility-administered medications for this visit.     Allergies:   Penicillins   Social History:  The patient  reports that she has never smoked. She has never used smokeless tobacco. She reports that she does not drink alcohol and does not use drugs.   Family History:   family history includes CAD in her father, maternal aunt, maternal grandmother, and paternal aunt; Heart attack in her father; Heart failure in her brother.    Review of Systems: Review of Systems  Constitutional: Negative.   HENT: Negative.    Respiratory:  Positive for shortness of breath.   Cardiovascular:  Positive for leg swelling.  Gastrointestinal: Negative.   Musculoskeletal: Negative.   Neurological: Negative.   Psychiatric/Behavioral: Negative.    All other systems reviewed and are negative.   PHYSICAL EXAM: VS:  BP 130/86 (BP Location: Left Arm, Patient Position: Sitting, Cuff Size: Normal)    Pulse 77    Ht 5\' 1"  (1.549 m)    Wt 240 lb 8 oz (109.1 kg)    SpO2 98%    BMI 45.44 kg/m  , BMI Body mass index is 45.44 kg/m. Constitutional:  oriented to person, place, and time. No distress.  HENT:  Head: Grossly normal Eyes:  no discharge. No scleral icterus.  Neck: No JVD, no carotid bruits  Cardiovascular: Regular rate and rhythm, no murmurs appreciated Pulmonary/Chest: Clear to auscultation bilaterally, no wheezes or rails Abdominal: Soft.  no distension.  no tenderness.  Musculoskeletal: Normal range of motion Neurological:  normal muscle tone. Coordination normal. No atrophy Skin: Skin warm and dry Psychiatric: normal affect, pleasant  Recent Labs: 05/07/2021: ALT 18; BUN 15; Creatinine, Ser 0.68; Hemoglobin 11.6; Magnesium 1.9;  Platelets 193; Potassium 3.8; Sodium 137    Lipid Panel Lab Results  Component Value Date   CHOL 155 01/06/2015   HDL 28 (L) 01/06/2015   LDLCALC 51 01/06/2015   TRIG 379 (H) 01/06/2015      Wt Readings from Last 3 Encounters:  12/19/21 240 lb 8 oz (109.1 kg)  05/07/21 247 lb (112 kg)  11/14/20 253 lb (114.8 kg)     ASSESSMENT AND PLAN:  Coronary artery disease, non-occlusive - Small vessel disease on prior cardiac catheterization No large vessel disease  Recommend she continue Imdur and Ranexa at current doses  Tachycardia - Doing well on beta-blocker, no changes made  HLD (hyperlipidemia)  We have requested lab work from primary care  In the past has been at goal  Controlled type 2 diabetes mellitus without complication, without long-term current use of insulin (HCC) Reports numbers have been higher, we have requested this from primary care  Morbid obesity due to excess calories (HCC) Recommend walking program, she is going to request an elliptical for Christmas  SOB Echo ordered If normal echo, would work on weight loss, conditioning   Total encounter time more than 25 minutes  Greater than 50% was spent in counseling and coordination of care with the patient    Orders Placed This Encounter  Procedures   EKG 12-Lead   ECHOCARDIOGRAM COMPLETE     Signed, Dossie Arbour, M.D., Ph.D. 12/20/2021  [facility] [place], [place] [phone]

## 2021-12-19 ENCOUNTER — Other Ambulatory Visit: Payer: Self-pay

## 2021-12-19 ENCOUNTER — Ambulatory Visit: Payer: 59 | Admitting: Cardiovascular Disease

## 2021-12-19 ENCOUNTER — Encounter: Payer: Self-pay | Admitting: Cardiovascular Disease

## 2021-12-19 VITALS — BP 130/86 | HR 77 | Ht 61.0 in | Wt 240.5 lb

## 2021-12-19 DIAGNOSIS — E119 Type 2 diabetes mellitus without complications: Secondary | ICD-10-CM | POA: Diagnosis not present

## 2021-12-19 DIAGNOSIS — E782 Mixed hyperlipidemia: Secondary | ICD-10-CM | POA: Diagnosis not present

## 2021-12-19 DIAGNOSIS — I2510 Atherosclerotic heart disease of native coronary artery without angina pectoris: Secondary | ICD-10-CM

## 2021-12-19 DIAGNOSIS — R0602 Shortness of breath: Secondary | ICD-10-CM

## 2021-12-19 MED ORDER — NEBIVOLOL HCL 10 MG PO TABS: 10.0000 mg | ORAL_TABLET | Freq: Every day | ORAL | 3 refills | Status: DC

## 2021-12-19 MED ORDER — ISOSORBIDE MONONITRATE ER 30 MG PO TB24: 30.0000 mg | ORAL_TABLET | Freq: Every day | ORAL | 3 refills | Status: DC

## 2021-12-19 MED ORDER — ATORVASTATIN CALCIUM 10 MG PO TABS: 10.0000 mg | ORAL_TABLET | Freq: Every day | ORAL | 3 refills | Status: DC

## 2021-12-19 MED ORDER — FUROSEMIDE 20 MG PO TABS: 20.0000 mg | ORAL_TABLET | Freq: Two times a day (BID) | ORAL | 3 refills | Status: AC | PRN

## 2021-12-19 NOTE — Patient Instructions (Addendum)
Medication Instructions:  °No changes ° °If you need a refill on your cardiac medications before your next appointment, please call your pharmacy.  ° °Lab work: °No new labs needed ° °Testing/Procedures: °Your physician has requested that you have an echocardiogram. Echocardiography is a painless test that uses sound waves to create images of your heart. It provides your doctor with information about the size and shape of your heart and how well your heart’s chambers and valves are working. This procedure takes approximately one hour. There are no restrictions for this procedure. ° °There is a possibility that an IV may need to be started during your test to inject an image enhancing agent. This is done to obtain more optimal pictures of your heart. Therefore we ask that you do at least drink some water prior to coming in to hydrate your veins.  ° ° °Follow-Up: °At [facility], you and your health needs are our priority.  As part of our continuing mission to provide you with exceptional heart care, we have created designated Provider Care Teams.  These Care Teams include your primary Cardiologist (physician) and Advanced Practice Providers (APPs -  Physician Assistants and Nurse Practitioners) who all work together to provide you with the care you need, when you need it. ° °You will need a follow up appointment in 12 months ° °Providers on your designated Care Team:   °Christopher Berge, NP °Ryan Dunn, PA-C °Cadence Furth, PA-C ° °COVID-19 Vaccine Information can be found at: [email or web address] For questions related to vaccine distribution or appointments, please email [email or web address] or call [phone].  ° °

## 2022-01-12 ENCOUNTER — Other Ambulatory Visit: Payer: 59

## 2022-02-05 ENCOUNTER — Other Ambulatory Visit: Payer: 59

## 2022-02-08 ENCOUNTER — Ambulatory Visit (INDEPENDENT_AMBULATORY_CARE_PROVIDER_SITE_OTHER): Payer: 59

## 2022-02-08 ENCOUNTER — Other Ambulatory Visit: Payer: Self-pay

## 2022-02-08 DIAGNOSIS — R0602 Shortness of breath: Secondary | ICD-10-CM

## 2022-02-09 LAB — ECHOCARDIOGRAM COMPLETE
Area-P 1/2: 3.85 cm2
Calc EF: 55.4 %
S' Lateral: 2.8 cm
Single Plane A2C EF: 60.4 %
Single Plane A4C EF: 54.7 %

## 2022-02-12 ENCOUNTER — Telehealth: Payer: Self-pay

## 2022-02-12 NOTE — Telephone Encounter (Signed)
Left detail message on VM of her recent ECHO results, okay by DPR, Dr. Mariah Milling advised  ? ?"Echocardiogram  ?Normal LV and RV function, there is relaxation issue/diastolic dysfunction which can contribute to shortness of breath.    ?Weight loss, aggressive blood pressure control would be the treatment  ?No significant valvular heart disease" ? ?At this time, no further recommendations or medications changes, advised to call office for any concerns or questions, otherwise will see at next visit.  ? ?Results released to MyChart by Dr. Mariah Milling ? ?

## 2022-07-31 ENCOUNTER — Telehealth: Payer: Self-pay | Admitting: Cardiovascular Disease

## 2022-07-31 MED ORDER — RANEXA 500 MG PO TB12: 1000.0000 mg | ORAL_TABLET | Freq: Two times a day (BID) | ORAL | 1 refills | Status: DC

## 2022-07-31 NOTE — Telephone Encounter (Signed)
Form received by fax RE: [facility] name Ranexa requested by patient. Form given to Dr. Mariah Milling for review and to assist in answering cardiovascular risk questions.

## 2022-07-31 NOTE — Telephone Encounter (Signed)
Pt c/o medication issue:  1. Name of Medication:   RANEXA 500 MG 12 hr tablet  2. How are you currently taking this medication (dosage and times per day)?  As prescribed  3. Are you having a reaction (difficulty breathing--STAT)?  No - Not with the name brand medication  4. What is your medication issue?   Husband called stating the patient uses the brand named medication and this medication is now going to be a generic.  Husband would like a prescription sent to [facility] - [place], [place] - [address], ph# [phone], pharmacy [phone] as the name brand medication works best for her and patient has about a week's worth of medication left.  Husband is concerned the generic version does not work as well.

## 2022-08-01 NOTE — Telephone Encounter (Signed)
Form completed and signed by Dr. Mariah Milling. Faxed to [facility] with documentation attached stating why patient can not take generic ranolazine.

## 2022-08-02 NOTE — Telephone Encounter (Signed)
Per fax received from [facility], Ranexa 500 mg tabs are no longer being manufactured. Generic ranolazine is available however patient states she is not able to tolerate the generic version. Please advise.

## 2022-08-02 NOTE — Telephone Encounter (Signed)
Husband called requested when the generic version of the Ranexa for his wife has been determined the prescription be sent to  [facility] [phone] - [place], [place] - [address]

## 2022-08-06 NOTE — Telephone Encounter (Signed)
Patient's husband is calling back to follow up regarding this due to not yet hearing back nor the pharmacy receiving anything. Please advise.

## 2022-08-08 ENCOUNTER — Telehealth: Payer: Self-pay | Admitting: Cardiovascular Disease

## 2022-08-08 NOTE — Telephone Encounter (Signed)
Pt c/o medication issue:  1. Name of Medication: RANEXA 500 MG 12 hr tablet  2. How are you currently taking this medication (dosage and times per day)?   3. Are you having a reaction (difficulty breathing--STAT)?   4. What is your medication issue? Pt spouse called asking for an update on another generic brand of this medication for pt to try.

## 2022-08-08 NOTE — Telephone Encounter (Signed)
Pt c/o medication issue:  1. Name of Medication: Ranexa 1000 mg  2. How are you currently taking this medication (dosage and times per day)? Not currently taking   3. Are you having a reaction (difficulty breathing--STAT)? No  4. What is your medication issue? Pharmacy is calling stating they are out of stock of 500 MG Ranexa and are requesting 1000 MG tablet prescription as an alternative.

## 2022-08-08 NOTE — Telephone Encounter (Signed)
Spoke to patient's husband he stated brand name Ranexa is no longer available in both strengths.Stated she had a reaction to the generic that caused her to be in hospital.He was told they are several different manufactures that make the generic.He does not know which manufacture that caused her reaction.He wanted to ask Dr.Gollan his advice.Message sent to Dr.Gollan.

## 2022-08-09 NOTE — Telephone Encounter (Signed)
Pt c/o medication issue:  1. Name of Medication: Ranexa 1000mg    2. How are you currently taking this medication (dosage and times per day)?   3. Are you having a reaction (difficulty breathing--STAT)?   4. What is your medication issue? Rachelle from [facility] called for an update on if pt can take 1000mg  tablets

## 2022-08-10 MED ORDER — RANOLAZINE ER 500 MG PO TB12: 500.0000 mg | ORAL_TABLET | Freq: Two times a day (BID) | ORAL | 1 refills | Status: DC

## 2022-08-10 NOTE — Telephone Encounter (Signed)
Spoke w/ pt's husband.  Advised him of Dr. Windell Hummingbird recommendation. He reports that he was advised by the pharmacy that brand name Ranexa is no longer available.  She cannot tolerate the generic. He would like an alternative med sent in, preferably a  30 day supply to see if she can tolerate.  He would like an answer by the end of the day today. Please advise.  Thank you.

## 2022-08-10 NOTE — Telephone Encounter (Signed)
Spoke w/ pt's husband.  Advised him of Dr. Windell Hummingbird recommendation:   There is no different medication for Ranexa, no equivalent/alternative  If unable to obtain branded from insurance company and if a generic is not an option  There is no way to go forward with Ranexa at all  It would need to be stopped and see how we go without   if there is any worsening chest pain,  She could increase the isosorbide up to 30 mg twice a day in effort to try to compensate  But it is a different mechanism  Most people have changed to the generic Ranexa  Thx  TGollan   He states that he wants to make sure she doesn't get the same manufacturer that she got when she had the reaction years ago.  Advised him that there is no way to tell that, as it depends on which manufacturer each particular pharmacy uses. He asks that I send in a 30 day supply of generic ranexa to the [facility] in [place] and he will see if she can toelrate it.   He will call back w/ updates.

## 2022-08-10 NOTE — Telephone Encounter (Signed)
She is welcome to try the 1000 twice daily dosing  Thx  TG   Left message for pt to call back.

## 2022-08-10 NOTE — Telephone Encounter (Signed)
Left message for pt's husband to call back.   She is welcome to try the 1000 twice daily dosing  Thx  TG

## 2022-08-10 NOTE — Telephone Encounter (Signed)
Patient's husband is returning call. 

## 2022-10-22 ENCOUNTER — Telehealth: Payer: Self-pay | Admitting: Cardiovascular Disease

## 2022-10-22 MED ORDER — RANOLAZINE ER 500 MG PO TB12: 500.0000 mg | ORAL_TABLET | Freq: Two times a day (BID) | ORAL | 0 refills | Status: DC

## 2022-10-22 NOTE — Telephone Encounter (Signed)
*  STAT* If patient is at the pharmacy, call can be transferred to refill team.   1. Which medications need to be refilled? (please list name of each medication and dose if known)   ranolazine (RANEXA) 500 MG 12 hr tablet    2. Which pharmacy/location (including street and city if local pharmacy) is medication to be sent to? [facility] [phone] - [place], [place] - [address]   3. Do they need a 30 day or 90 day supply?  90 day

## 2022-10-22 NOTE — Telephone Encounter (Signed)
Patient's husband is calling and wanted to talk with Dr. Windell Hummingbird nurse regarding medication

## 2022-10-22 NOTE — Telephone Encounter (Signed)
Left message for pt to call back. Advised her that I am working remote and will call back from a blocked #.

## 2022-10-23 ENCOUNTER — Other Ambulatory Visit: Payer: Self-pay

## 2022-10-23 MED ORDER — RANOLAZINE ER 500 MG PO TB12: 500.0000 mg | ORAL_TABLET | Freq: Two times a day (BID) | ORAL | 0 refills | Status: DC

## 2022-10-23 NOTE — Telephone Encounter (Signed)
Disp Refills Start End   ranolazine (RANEXA) 500 MG 12 hr tablet 180 tablet 0 10/23/2022    Sig - Route: Take 1 tablet (500 mg total) by mouth 2 (two) times daily. - Oral   Sent to pharmacy as: ranolazine (RANEXA) 500 MG 12 hr tablet   Notes to Pharmacy: Please schedule annual follow up for 12-2022, [phone]   E-Prescribing Status: Sent to pharmacy (10/23/2022  2:13 PM EST)    Pharmacy  [id number] - [place], [place] - [address] AT [address]

## 2022-10-23 NOTE — Telephone Encounter (Signed)
Husband calling back. Pharmacy is saying they dont have the prescription. Please see below

## 2022-10-23 NOTE — Telephone Encounter (Signed)
Attempted to call the patient/ her husband. No answer- I left a message to please call back.

## 2022-10-25 ENCOUNTER — Other Ambulatory Visit: Payer: Self-pay | Admitting: *Deleted

## 2022-10-25 MED ORDER — RANOLAZINE ER 500 MG PO TB12: 500.0000 mg | ORAL_TABLET | Freq: Two times a day (BID) | ORAL | 0 refills | Status: DC

## 2022-10-25 NOTE — Telephone Encounter (Signed)
3rd attempt to contact the patient. I was able to speak with the patient today. She was needing a refill for her Ranexa sent to the pharmay as she is almost out.  I advised the patient that her RX for Ranexa was sent to: ranolazine (RANEXA) 500 MG 12 hr tablet 180 tablet 0 10/23/2022    Sig - Route: Take 1 tablet (500 mg total) by mouth 2 (two) times daily. - Oral   Sent to pharmacy as: ranolazine (RANEXA) 500 MG 12 hr tablet   Notes to Pharmacy: Please schedule annual follow up for 12-2022, [phone]   E-Prescribing Status: Receipt confirmed by pharmacy (10/23/2022  2:13 PM EST)    Pharmacy  [facility] [phone] - [place], [place] - [address]    Per the patient, her RX needs to go the [facility] in [place]. She would like the [facility] removed from her pharmacy list.   I advised I will resend the RX to [facility] in [place]. The patient voices understanding and is agreeable.

## 2022-11-09 ENCOUNTER — Other Ambulatory Visit: Payer: Self-pay

## 2022-11-09 MED ORDER — RANOLAZINE ER 500 MG PO TB12: 500.0000 mg | ORAL_TABLET | Freq: Two times a day (BID) | ORAL | 0 refills | Status: DC

## 2023-01-01 ENCOUNTER — Ambulatory Visit: Payer: 59 | Attending: Medical | Admitting: Medical

## 2023-01-01 ENCOUNTER — Encounter: Payer: Self-pay | Admitting: Medical

## 2023-01-01 ENCOUNTER — Ambulatory Visit (INDEPENDENT_AMBULATORY_CARE_PROVIDER_SITE_OTHER): Payer: 59

## 2023-01-01 VITALS — BP 112/72 | HR 82 | Ht 61.0 in | Wt 252.0 lb

## 2023-01-01 DIAGNOSIS — R002 Palpitations: Secondary | ICD-10-CM

## 2023-01-01 DIAGNOSIS — I2510 Atherosclerotic heart disease of native coronary artery without angina pectoris: Secondary | ICD-10-CM

## 2023-01-01 DIAGNOSIS — R0602 Shortness of breath: Secondary | ICD-10-CM

## 2023-01-01 DIAGNOSIS — G479 Sleep disorder, unspecified: Secondary | ICD-10-CM

## 2023-01-01 DIAGNOSIS — I5032 Chronic diastolic (congestive) heart failure: Secondary | ICD-10-CM

## 2023-01-01 DIAGNOSIS — E782 Mixed hyperlipidemia: Secondary | ICD-10-CM

## 2023-01-01 DIAGNOSIS — E119 Type 2 diabetes mellitus without complications: Secondary | ICD-10-CM

## 2023-01-01 DIAGNOSIS — R072 Precordial pain: Secondary | ICD-10-CM

## 2023-01-01 MED ORDER — METOPROLOL TARTRATE 100 MG PO TABS: ORAL_TABLET | ORAL | 0 refills | Status: DC

## 2023-01-01 NOTE — Progress Notes (Signed)
Cardiology Office Note:    Date:  01/01/2023   ID:  Emma Bauer, DOB October 01, 1966, MRN [id number]  PCP:  Everlene Farrier, MD  [facility] Cardiologist:  None  [facility] Electrophysiologist:  None   Referring MD: Everlene Farrier, MD   Chief Complaint: 1 year follow-up  History of Present Illness:    Emma Bauer is a 57 y.o. 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  Patient has a history of cardiac cath in 2007 and 2016 showing no significant CAD.  Patient was started on Ranexa and Imdur.  She was seen in January 2023 for chest pain and leg swelling.  Echo was ordered which showed LV EF 78%, grade 2 diastolic dysfunction.  Today, the patient reports possible abnormal EKG from PCP's office. EKG shows NSR and no significant ST changes, however it is poor baseline. IT says 1st degree AV block, but PRI 125ms. Patient reports heart fluttering for the last 2 months. It can occur multiple times a day and episodes are brief. Her husband reports a lot shortness of breath, at rest and on exertion. She also reports chest pain that lasts a few minutes. Symptoms started 2 months and have been staying the same. She feels tired and cold. PCP is re-checking thyroid numbers. Patient reports occasional lower leg edema. Reports difficulty breathing at night. No dx of OSA, we can revisit at follow-up. She has doubled Ranexa to 1000 BID and chest pain has improved. She takes Lasix as needed. She takes Imdur 15mg  daily. She takes Aspirin 81mg  daily. PCP changed Celexa was changed to Vilazodone 20mg  for anxiety and depression. PCP did blood work in December, we will request labs.  Past Medical History:  Diagnosis Date   Asthma    Coronary artery disease, non-occlusive    a. 2007, non obstructive, symptoms felt to be 2/2 vasospasm    DM  (diabetes mellitus) ([place])    borderline   DVT (deep venous thrombosis) (HCC)    L leg 2/2 long car trip   HLD (hyperlipidemia)    HTN (hypertension)    Hypertriglyceridemia    Hypothyroidism    Obesity    Pseudotumor cerebri    lumbar to peritoneal shunt    Past Surgical History:  Procedure Laterality Date   ablasion     CARDIAC CATHETERIZATION  01/27/2015   [facility]   CESAREAN SECTION     x3    GALLBLADDER SURGERY     lumbar paritineal shunt      Current Medications: Current Meds  Medication Sig   albuterol (PROVENTIL) (2.5 MG/3ML) 0.083% nebulizer solution Inhale into the lungs.   albuterol (VENTOLIN HFA) 108 (90 Base) MCG/ACT inhaler Inhale into the lungs.   atorvastatin (LIPITOR) 10 MG tablet Take 1 tablet (10 mg total) by mouth daily.   budesonide-formoterol (SYMBICORT) 160-4.5 MCG/ACT inhaler Symbicort 160 mcg-4.5 mcg/actuation HFA aerosol inhaler  INL 2 PFS PO BID   cetirizine (ZYRTEC) 10 MG tablet Take 10 mg by mouth daily.   Cholecalciferol (VITAMIN D3) 125 MCG (5000 UT) TABS Take 1 tablet by mouth daily.   Cholecalciferol 125 MCG (5000 UT) TABS Take by mouth.   fenofibrate 160 MG tablet Take 160 mg by mouth daily.   furosemide (LASIX) 20 MG tablet Take 1 tablet (20 mg total) by mouth 2 (two) times  daily as needed.   isosorbide mononitrate (IMDUR) 30 MG 24 hr tablet Take 1 tablet (30 mg total) by mouth daily.   lansoprazole (PREVACID) 30 MG capsule Take 30 mg by mouth daily.    magnesium gluconate (MAGONATE) 500 MG tablet Take 500 mg by mouth 2 (two) times daily.   metFORMIN (GLUCOPHAGE) 500 MG tablet Take 1,000 mg by mouth daily.   metFORMIN (GLUCOPHAGE-XR) 500 MG 24 hr tablet Take 500 mg by mouth daily with breakfast.   metoprolol tartrate (LOPRESSOR) 100 MG tablet Take 1 tablet (100 mg) by mouth 2 hours prior to your Cardiac CT   montelukast (SINGULAIR) 10 MG tablet Take 10 mg by mouth daily.    MOUNJARO 5 MG/0.5ML Pen Inject 5 mg into the skin once a week.    nebivolol (BYSTOLIC) 10 MG tablet Take 1 tablet (10 mg total) by mouth daily.   nitroGLYCERIN (NITROSTAT) 0.4 MG SL tablet Place 1 tablet (0.4 mg total) under the tongue every 5 (five) minutes as needed for chest pain.   pregabalin (LYRICA) 75 MG capsule Take 75 mg by mouth 2 (two) times daily.    ranolazine (RANEXA) 500 MG 12 hr tablet Take 1 tablet (500 mg total) by mouth 2 (two) times daily. Please schedule annual follow up for 12-2022, [phone]   thyroid (ARMOUR) 180 MG tablet Take 180 mg by mouth daily.    zolmitriptan (ZOMIG) 5 MG tablet Take 5 mg by mouth as needed (headache).      Allergies:   Penicillins   Social History   Socioeconomic History   Marital status: Married    Spouse name: Not on file   Number of children: Not on file   Years of education: Not on file   Highest education level: Not on file  Occupational History   Not on file  Tobacco Use   Smoking status: Never   Smokeless tobacco: Never   Tobacco comments:    tobacco use - no  Substance and Sexual Activity   Alcohol use: No   Drug use: No   Sexual activity: Not on file  Other Topics Concern   Not on file  Social History Narrative   Married; full time; does not get regular exercise.    Social Determinants of Health   Financial Resource Strain: Not on file  Food Insecurity: Not on file  Transportation Needs: Not on file  Physical Activity: Not on file  Stress: Not on file  Social Connections: Not on file     Family History: The patient's family history includes CAD in her father, maternal aunt, maternal grandmother, and paternal aunt; Heart attack in her father; Heart failure in her brother.  ROS:   Please see the history of present illness.     All other systems reviewed and are negative.  EKGs/Labs/Other Studies Reviewed:    The following studies were reviewed today:  Echo 02/2022 1. Challenging images   2. Left ventricular ejection fraction, by estimation, is 55%. The left   ventricle has normal function. The left ventricle has no regional wall  motion abnormalities. Left ventricular diastolic parameters are consistent  with Grade II diastolic dysfunction  (pseudonormalization).   3. Right ventricular systolic function is normal. The right ventricular  size is normal.   4. The mitral valve is normal in structure. No evidence of mitral valve  regurgitation. No evidence of mitral stenosis.   5. The aortic valve was not well visualized. Aortic valve regurgitation  is not visualized. No aortic  stenosis is present.   6. The inferior vena cava is normal in size with greater than 50%  respiratory variability, suggesting right atrial pressure of 3 mmHg.   EKG:  EKG is ordered today.  The ekg ordered today demonstrates NSR 82 bpm, LAD, nonspecific T wave changes   Recent Labs: No results found for requested labs within last 365 days.  Recent Lipid Panel    Component Value Date/Time   CHOL 155 01/06/2015 0948   TRIG 379 (H) 01/06/2015 0948   TRIG 265 01/13/2010 0000   HDL 28 (L) 01/06/2015 0948   VLDL 76 (H) 01/06/2015 0948   LDLCALC 51 01/06/2015 0948   LDLCALC 54 04/22/2009 0000    Physical Exam:    VS:  BP 112/72 (BP Location: Left Arm, Patient Position: Sitting, Cuff Size: Normal)   Pulse 82   Ht 5\' 1"  (1.549 m)   Wt 252 lb (114.3 kg)   SpO2 97%   BMI 47.61 kg/m     Wt Readings from Last 3 Encounters:  01/01/23 252 lb (114.3 kg)  12/19/21 240 lb 8 oz (109.1 kg)  05/07/21 247 lb (112 kg)     GEN:  Well nourished, well developed in no acute distress HEENT: Normal NECK: No JVD; No carotid bruits LYMPHATICS: No lymphadenopathy CARDIAC: RRR, no murmurs, rubs, gallops RESPIRATORY:  diffusely diminished  ABDOMEN: Soft, non-tender, non-distended MUSCULOSKELETAL:  No edema; No deformity  SKIN: Warm and dry NEUROLOGIC:  Alert and oriented x 3 PSYCHIATRIC:  Normal affect   ASSESSMENT:    1. Coronary artery disease, non-occlusive   2. Chronic  diastolic heart failure (HCC)   3. Shortness of breath   4. Palpitations   5. Hyperlipidemia, mixed   6. Morbid obesity due to excess calories (HCC)   7. Controlled type 2 diabetes mellitus without complication, without long-term current use of insulin (HCC)   8. Precordial pain   9. Sleep disturbance    PLAN:    In order of problems listed above:  Chest pain CAD Patient reports intermittent chest pain and shortness of breath for the last 2 months.  Prior cath in 2007 and 2016 showed nonobstructive CAD.  She reports improved chest pain with increase of Ranexa. I will order a cardiac CTA.  Continue aspirin 81 mg daily, Lipitor 10 mg daily, Imdur 15 mg daily, Bystolic 10mg  daily, Ranexa 1000 mg twice daily.  Blood pressure soft limiting titration of medication.  SOB HFpEF She reports shortness of breath on exertion and at rest. Patient appears euvolemic on exam. General deconditioning may be contributing to symptoms. She takes Lasix as needed for volume management.  Request lab work from PCPs office. Prior echo in 2023 showed LVEF 55% and G2DD. I will repeat an echocardiogram. Continue BB therapy.   Palpitations Abnormal EKG EKG at PCPs office shows sinus rhythm, and no significant abnormalities. IT reads 1st degree AV block, but PTI is . EKG today shows sinus rhythm with a heart rate of 82 bpm with TWI AVL, which is unchanged. patient reports intermittent palpitations that occur daily.  She reports her heart is fluttering.  I will request labs from PCP.  I will order a 2-week heart monitor.  HLD Request labs as above. Continue Lipitor 10mg  daily.   DM2 Followed by her PCP, request labs as above.  Morbid obesity Weight loss recommended.  Sleep issues Patient may need referral to pulmonology for OSA workup, will discuss at follow-up.  Disposition: Follow up in 6-8 week(s) with  MD/APP    Signed, Kentley Blyden David [place], PA-C  01/01/2023 9:28 AM    [facility]  [facility]

## 2023-01-01 NOTE — Patient Instructions (Addendum)
Medication Instructions:  Your physician recommends that you continue on your current medications as directed. Please refer to the Current Medication list given to you today.  *If you need a refill on your cardiac medications before your next appointment, please call your pharmacy*   Lab Work: - Pending what we receive from you PCP  If you have labs (blood work) drawn today and your tests are completely normal, you will receive your results only by: MyChart Message (if you have MyChart) OR A paper copy in the mail If you have any lab test that is abnormal or we need to change your treatment, we will call you to review the results.   Testing/Procedures:  Echocardiogram Your physician has requested that you have an echocardiogram. Echocardiography is a painless test that uses sound waves to create images of your heart. It provides your doctor with information about the size and shape of your heart and how well your heart's chambers and valves are working. This procedure takes approximately one hour. There are no restrictions for this procedure. There is a possibility that an IV may need to be started during your test to inject an image enhancing agent. This is done to obtain more optimal pictures of your heart. Therefore we ask that you do at least drink some water prior to coming in to hydrate your veins.   Please do NOT wear cologne, perfume, aftershave, or lotions (deodorant is allowed). Please arrive 15 minutes prior to your appointment time.     2. Cardiac CT: Your physician has requested that you have cardiac CT. Cardiac computed tomography (CT) is a painless test that uses an x-ray machine to take clear, detailed pictures of your heart.   February 5: @ 11:00am, 1:16pm, 1:45pm, 3:30pm February 8: @ 8:00am, 8:45am, 12:30pm, 1:15pm, 2:00pm, 2:45pm or 3:30pm  Your cardiac CT will be scheduled at one of the below locations:    [facility] [address] [place], [place] [address] [phone]  OR   [facility] [address] [place], [place] [address] [phone]   If scheduled at [facility] or [facility], please arrive 15 mins early for check-in and test prep.   Please follow these instructions carefully (unless otherwise directed):   We will administer nitroglycerin during this exam.   On the Night Before the Test: Be sure to Drink plenty of water. Do not consume any caffeinated/decaffeinated beverages or chocolate 12 hours prior to your test. Do not take any antihistamines 12 hours prior to your test.   On the Day of the Test: Drink plenty of water until 1 hour prior to the test. Do not eat any food 1 hour prior to test. You may take your regular medications prior to the test.  Take metoprolol (Lopressor) 100 mg two hours prior to test. HOLD Furosemide/Hydrochlorothiazide morning of the test. FEMALES- please wear underwire-free bra if available, avoid dresses & tight clothing        After the Test: Drink plenty of water. After receiving IV contrast, you may experience a mild flushed feeling. This is normal. On occasion, you may experience a mild rash up to 24 hours after the test. This is not dangerous. If this occurs, you can take Benadryl 25 mg and increase your fluid intake. If you experience trouble breathing, this can be serious. If it is severe call 911 IMMEDIATELY. If it is mild, please call our office. If you take  any of these medications: Glipizide/Metformin, Avandament, Glucavance, please do not take 48 hours after completing test unless otherwise instructed.   We will call to schedule your test 2-4 weeks out understanding that some insurance companies will need an authorization prior to the service being performed.   For non-scheduling related questions, please contact the cardiac imaging nurse navigator should you  have any questions/concerns: Marchia Bond, Cardiac Imaging Nurse Navigator Gordy Clement, Cardiac Imaging Nurse Navigator [facility] Direct Office Dial: [phone]   For scheduling needs, including cancellations and rescheduling, please call [place], [phone].    3. Heart Monitor:  Length of Wear: 14 days  Your monitor will be mailed to your home address within 3-5 business days. However, if you have not received your monitor after 5 business days please send [place] a MyChart message or call the office at [phone], so we may follow up on this for you.   Your physician has recommended that you wear a Zio XT monitor.   This monitor is a medical device that records the heart's electrical activity. Doctors most often use these monitors to diagnose arrhythmias. Arrhythmias are problems with the speed or rhythm of the heartbeat. The monitor is a small device applied to your chest. You can wear one while you do your normal daily activities. While wearing this monitor if you have any symptoms to push the button and record what you felt. Once you have worn this monitor for the period of time provider prescribed (Usually 14 days), you will return the monitor device in the postage paid box. Once it is returned they will download the data collected and provide [place] with a report which the provider will then review and we will call you with those results. Important tips:  Avoid showering during the first 24 hours of wearing the monitor. Avoid excessive sweating to help maximize wear time. Do not submerge the device, no hot tubs, and no swimming pools. Keep any lotions or oils away from the patch. After 24 hours you may shower with the patch on. Take brief showers with your back facing the shower head.  Do not remove patch once it has been placed because that will interrupt data and decrease adhesive wear time. Push the button when you have any symptoms and write down  what you were feeling. Once you have completed wearing your monitor, remove and place into box which has postage paid and place in your outgoing mailbox.  If for some reason you have misplaced your box then call our office and we can provide another box and/or mail it off for you.      Follow-Up: At [facility], you and your health needs are our priority.  As part of our continuing mission to provide you with exceptional heart care, we have created designated Provider Care Teams.  These Care Teams include your primary Cardiologist (physician) and Advanced Practice Providers (APPs -  Physician Assistants and Nurse Practitioners) who all work together to provide you with the care you need, when you need it.  We recommend signing up for the patient portal called "MyChart".  Sign up information is provided on this After Visit Summary.  MyChart is used to connect with patients for Virtual Visits (Telemedicine).  Patients are able to view lab/test results, encounter notes, upcoming appointments, etc.  Non-urgent messages can be sent to your provider as well.   To learn more about what you can do with MyChart, go to [email or web address].  Your next appointment:   6-8  week(s)  Provider:   You may see Julien Nordmann, MD or one of the following Advanced Practice Providers on your designated Care Team:   Cadence Fransico Michael, [place]  Other Instructions Echocardiogram An echocardiogram is a test that uses sound waves (ultrasound) to produce images of the heart. Images from an echocardiogram can provide important information about: Heart size and shape. The size and thickness and movement of your heart's walls. Heart muscle function and strength. Heart valve function or if you have stenosis. Stenosis is when the heart valves are too narrow. If blood is flowing backward through the heart valves (regurgitation). A tumor or infectious growth around the heart valves. Areas of heart muscle that  are not working well because of poor blood flow or injury from a heart attack. Aneurysm detection. An aneurysm is a weak or damaged part of an artery wall. The wall bulges out from the normal force of blood pumping through the body. Tell a health care provider about: Any allergies you have. All medicines you are taking, including vitamins, herbs, eye drops, creams, and over-the-counter medicines. Any blood disorders you have. Any surgeries you have had. Any medical conditions you have. Whether you are pregnant or may be pregnant. What are the risks? Generally, this is a safe test. However, problems may occur, including an allergic reaction to dye (contrast) that may be used during the test. What happens before the test? No specific preparation is needed. You may eat and drink normally. What happens during the test?  You will take off your clothes from the waist up and put on a hospital gown. Electrodes or electrocardiogram (ECG)patches may be placed on your chest. The electrodes or patches are then connected to a device that monitors your heart rate and rhythm. You will lie down on a table for an ultrasound exam. A gel will be applied to your chest to help sound waves pass through your skin. A handheld device, called a transducer, will be pressed against your chest and moved over your heart. The transducer produces sound waves that travel to your heart and bounce back (or "echo" back) to the transducer. These sound waves will be captured in real-time and changed into images of your heart that can be viewed on a video monitor. The images will be recorded on a computer and reviewed by your health care provider. You may be asked to change positions or hold your breath for a short time. This makes it easier to get different views or better views of your heart. In some cases, you may receive contrast through an IV in one of your veins. This can improve the quality of the pictures from your heart. The  procedure may vary among health care providers and hospitals. What can I expect after the test? You may return to your normal, everyday life, including diet, activities, and medicines, unless your health care provider tells you not to do that. Follow these instructions at home: It is up to you to get the results of your test. Ask your health care provider, or the department that is doing the test, when your results will be ready. Keep all follow-up visits. This is important. Summary An echocardiogram is a test that uses sound waves (ultrasound) to produce images of the heart. Images from an echocardiogram can provide important information about the size and shape of your heart, heart muscle function, heart valve function, and other possible heart problems. You do not need to do  anything to prepare before this test. You may eat and drink normally. After the echocardiogram is completed, you may return to your normal, everyday life, unless your health care provider tells you not to do that. This information is not intended to replace advice given to you by your health care provider. Make sure you discuss any questions you have with your health care provider. Document Revised: 08/09/2021 Document Reviewed: 07/19/2020 Elsevier Patient Education  2023 Elsevier Inc.  Cardiac CT Angiogram A cardiac CT angiogram is a procedure to look at the heart and the area around the heart. It may be done to help find the cause of chest pains or other symptoms of heart disease. During this procedure, a substance called contrast dye is injected into the blood vessels in the area to be checked. A large X-ray machine, called a CT scanner, then takes detailed pictures of the heart and the surrounding area. The procedure is also sometimes called a coronary CT angiogram, coronary artery scanning, or CTA. A cardiac CT angiogram allows the health care provider to see how well blood is flowing to and from the heart. The health  care provider will be able to see if there are any problems, such as: Blockage or narrowing of the coronary arteries in the heart. Fluid around the heart. Signs of weakness or disease in the muscles, valves, and tissues of the heart. Tell a health care provider about: Any allergies you have. This is especially important if you have had a previous allergic reaction to contrast dye. All medicines you are taking, including vitamins, herbs, eye drops, creams, and over-the-counter medicines. Any blood disorders you have. Any surgeries you have had. Any medical conditions you have. Whether you are pregnant or may be pregnant. Any anxiety disorders, chronic pain, or other conditions you have that may increase your stress or prevent you from lying still. What are the risks? Generally, this is a safe procedure. However, problems may occur, including: Bleeding. Infection. Allergic reactions to medicines or dyes. Damage to other structures or organs. Kidney damage from the contrast dye that is used. Increased risk of cancer from radiation exposure. This risk is low. Talk with your health care provider about: The risks and benefits of testing. How you can receive the lowest dose of radiation. What happens before the procedure? Wear comfortable clothing and remove any jewelry, glasses, dentures, and hearing aids. Follow instructions from your health care provider about eating and drinking. This may include: For 12 hours before the procedure -- avoid caffeine. This includes tea, coffee, soda, energy drinks, and diet pills. Drink plenty of water or other fluids that do not have caffeine in them. Being well hydrated can prevent complications. For 4-6 hours before the procedure -- stop eating and drinking. The contrast dye can cause nausea, but this is less likely if your stomach is empty. Ask your health care provider about changing or stopping your regular medicines. This is especially important if you  are taking diabetes medicines, blood thinners, or medicines to treat problems with erections (erectile dysfunction). What happens during the procedure?  Hair on your chest may need to be removed so that small sticky patches called electrodes can be placed on your chest. These will transmit information that helps to monitor your heart during the procedure. An IV will be inserted into one of your veins. You might be given a medicine to control your heart rate during the procedure. This will help to ensure that good images are obtained. You will  be asked to lie on an exam table. This table will slide in and out of the CT machine during the procedure. Contrast dye will be injected into the IV. You might feel warm, or you may get a metallic taste in your mouth. You will be given a medicine called nitroglycerin. This will relax or dilate the arteries in your heart. The table that you are lying on will move into the CT machine tunnel for the scan. The person running the machine will give you instructions while the scans are being done. You may be asked to: Keep your arms above your head. Hold your breath. Stay very still, even if the table is moving. When the scanning is complete, you will be moved out of the machine. The IV will be removed. The procedure may vary among health care providers and hospitals. What can I expect after the procedure? After your procedure, it is common to have: A metallic taste in your mouth from the contrast dye. A feeling of warmth. A headache from the nitroglycerin. Follow these instructions at home: Take over-the-counter and prescription medicines only as told by your health care provider. If you are told, drink enough fluid to keep your urine pale yellow. This will help to flush the contrast dye out of your body. Most people can return to their normal activities right after the procedure. Ask your health care provider what activities are safe for you. It is up to  you to get the results of your procedure. Ask your health care provider, or the department that is doing the procedure, when your results will be ready. Keep all follow-up visits as told by your health care provider. This is important. Contact a health care provider if: You have any symptoms of allergy to the contrast dye. These include: Shortness of breath. Rash or hives. A racing heartbeat. Summary A cardiac CT angiogram is a procedure to look at the heart and the area around the heart. It may be done to help find the cause of chest pains or other symptoms of heart disease. During this procedure, a large X-ray machine, called a CT scanner, takes detailed pictures of the heart and the surrounding area after a contrast dye has been injected into blood vessels in the area. Ask your health care provider about changing or stopping your regular medicines before the procedure. This is especially important if you are taking diabetes medicines, blood thinners, or medicines to treat erectile dysfunction. If you are told, drink enough fluid to keep your urine pale yellow. This will help to flush the contrast dye out of your body. This information is not intended to replace advice given to you by your health care provider. Make sure you discuss any questions you have with your health care provider. Document Revised: 03/15/2022 Document Reviewed: 07/22/2019 Elsevier Patient Education  [place].

## 2023-01-04 ENCOUNTER — Encounter: Payer: Self-pay | Admitting: *Deleted

## 2023-01-07 DIAGNOSIS — R002 Palpitations: Secondary | ICD-10-CM

## 2023-01-15 ENCOUNTER — Telehealth: Payer: Self-pay | Admitting: Cardiovascular Disease

## 2023-01-15 NOTE — Telephone Encounter (Signed)
Pt c/o medication issue:  1. Name of Medication: ranolazine (RANEXA) 500 MG 12 hr tablet   2. How are you currently taking this medication (dosage and times per day)? 1 tablet by mouth 4x daily   3. Are you having a reaction (difficulty breathing--STAT)? No   4. What is your medication issue? Husband is calling stating patient has ran out of this medication but the mail service will not becoming for over another 20 days. This is due to the prescription being written for 2 tablets daily when the patient is taking 4. They are requesting a new prescription be written and sent to the local pharmacy of [facility] in [place] to last her until the new prescription arrives. They also need the updated prescription sent to the mail service as well.

## 2023-01-16 MED ORDER — RANOLAZINE ER 500 MG PO TB12: 1000.0000 mg | ORAL_TABLET | Freq: Two times a day (BID) | ORAL | 3 refills | Status: DC

## 2023-01-16 MED ORDER — RANOLAZINE ER 500 MG PO TB12: 1000.0000 mg | ORAL_TABLET | Freq: Two times a day (BID) | ORAL | 0 refills | Status: DC

## 2023-01-16 NOTE — Telephone Encounter (Signed)
Per chart review, Cadence indicated on 01/01/23 pt should be taking Ranexa 1000 mg BID. Prescription updated 30 day temporary supply sent to [facility] and [facility].  Pt's husband made aware.

## 2023-01-21 ENCOUNTER — Ambulatory Visit: Admission: RE | Admit: 2023-01-21 | Payer: 59 | Source: Ambulatory Visit

## 2023-01-25 ENCOUNTER — Telehealth (HOSPITAL_COMMUNITY): Payer: Self-pay | Admitting: *Deleted

## 2023-01-25 NOTE — Telephone Encounter (Signed)
Attempted to call patient regarding upcoming cardiac CT appointment. °Left message on voicemail with name and callback number ° °Biff Rutigliano RN Navigator Cardiac Imaging °[facility] °[phone] Office °[phone] Cell ° °

## 2023-01-27 ENCOUNTER — Other Ambulatory Visit: Payer: Self-pay | Admitting: Cardiovascular Disease

## 2023-01-28 ENCOUNTER — Other Ambulatory Visit: Payer: Self-pay | Admitting: Medical

## 2023-01-28 ENCOUNTER — Other Ambulatory Visit (HOSPITAL_COMMUNITY): Payer: Self-pay | Admitting: *Deleted

## 2023-01-28 ENCOUNTER — Ambulatory Visit
Admission: RE | Admit: 2023-01-28 | Discharge: 2023-01-28 | Disposition: A | Discharge status: Home / Self Care | Payer: 59 | Source: Ambulatory Visit | Attending: Medical | Admitting: Medical

## 2023-01-28 DIAGNOSIS — R072 Precordial pain: Secondary | ICD-10-CM

## 2023-01-28 MED ORDER — METOPROLOL TARTRATE 100 MG PO TABS: ORAL_TABLET | ORAL | 0 refills | Status: DC

## 2023-01-28 MED ORDER — DILTIAZEM HCL 25 MG/5ML IV SOLN
INTRAVENOUS | Status: AC
  Filled 2023-01-28: qty 5

## 2023-01-28 MED ORDER — IOHEXOL 350 MG/ML SOLN
100.0000 mL | Freq: Once | INTRAVENOUS | Status: AC | PRN
  Administered 2023-01-28: 100 mL via INTRAVENOUS

## 2023-01-28 MED ORDER — METOPROLOL TARTRATE 5 MG/5ML IV SOLN
10.0000 mg | Freq: Once | INTRAVENOUS | Status: AC
  Administered 2023-01-28: 10 mg via INTRAVENOUS

## 2023-01-28 MED ORDER — METOPROLOL TARTRATE 5 MG/5ML IV SOLN
INTRAVENOUS | Status: AC
  Filled 2023-01-28: qty 10

## 2023-01-28 MED ORDER — IVABRADINE HCL 7.5 MG PO TABS: ORAL_TABLET | ORAL | 0 refills | Status: DC

## 2023-01-28 MED ORDER — DILTIAZEM HCL 25 MG/5ML IV SOLN
10.0000 mg | Freq: Once | INTRAVENOUS | Status: AC
  Administered 2023-01-28: 10 mg via INTRAVENOUS

## 2023-01-28 MED ORDER — NITROGLYCERIN 0.4 MG SL SUBL: 0.8000 mg | SUBLINGUAL_TABLET | Freq: Once | SUBLINGUAL | Status: AC

## 2023-01-28 MED ORDER — NITROGLYCERIN 0.4 MG SL SUBL
SUBLINGUAL_TABLET | SUBLINGUAL | Status: AC
  Administered 2023-01-28: 0.8 mg via SUBLINGUAL
  Filled 2023-01-28: qty 1

## 2023-01-28 NOTE — Progress Notes (Signed)
Pt HR consistently 76-80, pt given 10 mg of metoprolol and 10 mg of Cardizem with no effect. MD aware, pt to have scan rescheduled with more medication called in for pt to pharmacy. Pt made aware of changes.

## 2023-02-01 ENCOUNTER — Telehealth: Payer: Self-pay | Admitting: Cardiovascular Disease

## 2023-02-01 ENCOUNTER — Other Ambulatory Visit: Payer: Self-pay

## 2023-02-01 MED ORDER — RANOLAZINE ER 500 MG PO TB12: 1000.0000 mg | ORAL_TABLET | Freq: Two times a day (BID) | ORAL | 3 refills | Status: DC

## 2023-02-01 NOTE — Telephone Encounter (Signed)
Disp Refills Start End   ranolazine (RANEXA) 500 MG 12 hr tablet 360 tablet 3 02/01/2023    Sig - Route: Take 2 tablets (1,000 mg total) by mouth 2 (two) times daily. - Oral   Sent to pharmacy as: ranolazine (RANEXA) 500 MG 12 hr tablet   E-Prescribing Status: Receipt confirmed by pharmacy (02/01/2023 12:44 PM EST)    Pharmacy  [facility] [place], [place]

## 2023-02-01 NOTE — Telephone Encounter (Signed)
*  STAT* If patient is at the pharmacy, call can be transferred to refill team.   1. Which medications need to be refilled? (please list name of each medication and dose if known) ranolazine (RANEXA) 500 MG 12 hr tablet   2. Which pharmacy/location (including street and city if local pharmacy) is medication to be sent to? [facility] [place], [place]  3. Do they need a 30 day or 90 day supply? 48   Pts husband states that [facility] is supposed to send a new prescription over for the Ranexa 525m, x4 daily. He states if they haven't done so to let him know and he'll call the pharmacy again.

## 2023-02-04 ENCOUNTER — Other Ambulatory Visit: Payer: Self-pay | Admitting: Cardiovascular Disease

## 2023-02-04 ENCOUNTER — Telehealth: Payer: Self-pay | Admitting: Cardiovascular Disease

## 2023-02-04 NOTE — Telephone Encounter (Signed)
LVM to reschedule due to provider being out of office on 02/27/2023. LS

## 2023-02-13 ENCOUNTER — Other Ambulatory Visit: Payer: Self-pay | Admitting: Cardiovascular Disease

## 2023-02-14 ENCOUNTER — Ambulatory Visit: Payer: 59

## 2023-02-25 ENCOUNTER — Ambulatory Visit: Admission: RE | Admit: 2023-02-25 | Payer: 59 | Source: Ambulatory Visit

## 2023-02-27 ENCOUNTER — Ambulatory Visit: Payer: 59 | Admitting: Cardiovascular Disease

## 2023-03-26 ENCOUNTER — Other Ambulatory Visit: Payer: Self-pay

## 2023-03-26 ENCOUNTER — Ambulatory Visit: Payer: 59 | Attending: Medical

## 2023-03-26 DIAGNOSIS — R0602 Shortness of breath: Secondary | ICD-10-CM

## 2023-03-26 MED ORDER — PERFLUTREN LIPID MICROSPHERE
1.0000 mL | INTRAVENOUS | Status: AC | PRN
  Administered 2023-03-26: 2 mL via INTRAVENOUS

## 2023-03-27 LAB — ECHOCARDIOGRAM COMPLETE
AR max vel: 2.29 cm2
AV Area VTI: 2.37 cm2
AV Area mean vel: 2.32 cm2
AV Mean grad: 4 mmHg
AV Peak grad: 6.8 mmHg
Ao pk vel: 1.31 m/s
Area-P 1/2: 4.31 cm2
S' Lateral: 3.2 cm

## 2023-04-18 ENCOUNTER — Encounter (HOSPITAL_COMMUNITY): Payer: Self-pay

## 2023-04-22 NOTE — Progress Notes (Unsigned)
Cardiology Office Note  Date:  04/23/2023   ID:  JOYIA SALTS, DOB 06-26-1966, MRN [id number]  PCP:  Junius Roads, MD   Chief Complaint  Patient presents with   Chest Pain    Patient c/o shortness of breath & chest tightness comes and goes. Medications reviewed by the patient verbally.     HPI:  Ms. 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,  remote DVT of the left leg after a long car trip,  Chronic chest pain dating back to 2016 cardiac catheterization in 2007 and 2016 for chest pain that showed noncritical CAD,  who presents for routine followup of her chest pain And leg swelling  Last seen by myself in clinic January 2023 Seen by one of our colleagues January 2024 On that visit fluttering, symptoms of shortness of breath, chest pain lasting several minutes Went up on Ranexa 1000 twice daily  Recent imaging and testing reviewed Calcium score 0 Unable to perform cardiac CTA secondary to heart rate  Echo March 26, 2023, normal EF  Zio monitor February 2024 Normal sinus rhythm Rare short episodes tachycardia, triggered events associated with normal rhythm, rare PVC  Continues to work long hours Works in Conservation officer, historic buildings, [employer] 9 nursing homes  Visit work but no regular exercise program [employer] continues to run high  No Chest pain episode  on branded ranexa and imdur, sx stable  Total cholesterol 173, triglycerides 619 LDL 52 A1c 6.9  EKG personally reviewed by myself on todays visit hows normal sinus rhythm rate 77 bpm, LAD,  no significant ST or T-wave changes   Other past medical hx  cardiac catheterization 2016 showing no significant coronary artery disease Started on ranexa and low-dose isosorbide (1/2 dose) No further significant chest pain   PMH:   has a past medical history of Asthma, Coronary artery disease, non-occlusive, DM (diabetes  mellitus) (HCC), DVT (deep venous thrombosis) (HCC), HLD (hyperlipidemia), HTN (hypertension), Hypertriglyceridemia, Hypothyroidism, Obesity, and Pseudotumor cerebri.  PSH:    Past Surgical History:  Procedure Laterality Date   ablasion     CARDIAC CATHETERIZATION  01/27/2015   [facility]   CESAREAN SECTION     x3    GALLBLADDER SURGERY     lumbar paritineal shunt      Current Outpatient Medications  Medication Sig Dispense Refill   albuterol (PROVENTIL) (2.5 MG/3ML) 0.083% nebulizer solution Inhale into the lungs.     albuterol (VENTOLIN HFA) 108 (90 Base) MCG/ACT inhaler Inhale into the lungs.     atorvastatin (LIPITOR) 10 MG tablet Take 1 tablet (10 mg total) by mouth daily. 90 tablet 3   budesonide-formoterol (SYMBICORT) 160-4.5 MCG/ACT inhaler      cetirizine (ZYRTEC) 10 MG tablet Take 10 mg by mouth daily.     Cholecalciferol (VITAMIN D3) 125 MCG (5000 UT) TABS Take 1 tablet by mouth daily.     ezetimibe (ZETIA) 10 MG tablet Take 1 tablet (10 mg total) by mouth daily. 30 tablet 6   fenofibrate 160 MG tablet Take 160 mg by mouth daily.     furosemide (LASIX) 20 MG tablet Take 1 tablet (20 mg total) by mouth 2 (two) times daily as needed. 60 tablet 3   isosorbide mononitrate (IMDUR) 30 MG 24 hr tablet TAKE 1 TABLET(30 MG) BY MOUTH DAILY 90 tablet 3   lansoprazole (PREVACID) 30 MG capsule Take 30 mg by  mouth daily.   5   magnesium gluconate (MAGONATE) 500 MG tablet Take 500 mg by mouth 2 (two) times daily.     metFORMIN (GLUCOPHAGE) 500 MG tablet Take 1,000 mg by mouth daily.     metoprolol tartrate (LOPRESSOR) 100 MG tablet Take 1 tablet (100 mg) by mouth 2 hours prior to your Cardiac CT 1 tablet 0   montelukast (SINGULAIR) 10 MG tablet Take 10 mg by mouth daily.      MOUNJARO 5 MG/0.5ML Pen Inject 5 mg into the skin once a week.     nebivolol (BYSTOLIC) 10 MG tablet TAKE 1 WUJWJX(91 MG) BY MOUTH DAILY 90 tablet 0   nitroGLYCERIN (NITROSTAT) 0.4 MG SL tablet Place 1 tablet (0.4 mg  total) under the tongue every 5 (five) minutes as needed for chest pain. 25 tablet 6   pregabalin (LYRICA) 75 MG capsule Take 75 mg by mouth 2 (two) times daily.      ranolazine (RANEXA) 500 MG 12 hr tablet Take 2 tablets (1,000 mg total) by mouth 2 (two) times daily. 360 tablet 3   thyroid (ARMOUR) 180 MG tablet Take 180 mg by mouth daily.      traMADol (ULTRAM) 50 MG tablet Take 50 mg by mouth every 6 (six) hours as needed.     Vilazodone HCl 20 MG TABS Take 20 mg by mouth daily.     zolmitriptan (ZOMIG) 5 MG tablet Take 5 mg by mouth as needed (headache).      citalopram (CELEXA) 20 MG tablet Take 20 mg by mouth daily. Take one and half tablet daily. (Patient not taking: Reported on 01/01/2023)     No current facility-administered medications for this visit.     Allergies:   Penicillins   Social History:  The patient  reports that she has never smoked. She has never used smokeless tobacco. She reports that she does not drink alcohol and does not use drugs.   Family History:   family history includes CAD in her father, maternal aunt, maternal grandmother, and paternal aunt; Heart attack in her father; Heart failure in her brother.    Review of Systems: Review of Systems  Constitutional: Negative.   HENT: Negative.    Respiratory:  Positive for shortness of breath.   Cardiovascular:  Positive for leg swelling.  Gastrointestinal: Negative.   Musculoskeletal: Negative.   Neurological: Negative.   Psychiatric/Behavioral: Negative.    All other systems reviewed and are negative.    PHYSICAL EXAM: VS:  BP 130/72 (BP Location: Left Arm, Patient Position: Sitting, Cuff Size: Normal)   Pulse 83   Ht 5\' 1"  (1.549 m)   Wt 246 lb (111.6 kg)   SpO2 98%   BMI 46.48 kg/m  , BMI Body mass index is 46.48 kg/m. Constitutional:  oriented to person, place, and time. No distress.  HENT:  Head: Grossly normal Eyes:  no discharge. No scleral icterus.  Neck: No JVD, no carotid bruits   Cardiovascular: Regular rate and rhythm, no murmurs appreciated Pulmonary/Chest: Clear to auscultation bilaterally, no wheezes or rails Abdominal: Soft.  no distension.  no tenderness.  Musculoskeletal: Normal range of motion Neurological:  normal muscle tone. Coordination normal. No atrophy Skin: Skin warm and dry Psychiatric: normal affect, pleasant  Recent Labs: No results found for requested labs within last 365 days.    Lipid Panel Lab Results  Component Value Date   CHOL 155 01/06/2015   HDL 28 (L) 01/06/2015   LDLCALC 51 01/06/2015  TRIG 379 (H) 01/06/2015      Wt Readings from Last 3 Encounters:  04/23/23 246 lb (111.6 kg)  01/01/23 252 lb (114.3 kg)  12/19/21 240 lb 8 oz (109.1 kg)     ASSESSMENT AND PLAN:  Coronary artery disease, non-occlusive - Small vessel disease on prior cardiac catheterization 2017 No large vessel disease Currently chest pain symptoms well-controlled when she is on her medications Recommend she continue Imdur 30 daily and Ranexa 1000 twice daily with her beta-blocker   Tachycardia - Doing well on beta-blocker, no changes made Zio monitor with minimal short tachycardia spells Triggered events associated with normal sinus rhythm, PVCs  HLD (hyperlipidemia)  Recommend she add Zetia 10 mg daily to her Lipitor 10 She is not particular interested in starting Repatha which was started by primary care Repatha less beneficial for triglycerides Already on fenofibrate recommended calorie/carbs restriction  Controlled type 2 diabetes mellitus without complication, without long-term current use of insulin (HCC) Recommend strict low-carb diet  Morbid obesity due to excess calories (HCC) Recommend walking program,  Consider GLP-1  SOB Recommend walking program for conditioning, weight loss, low carbohydrates   Total encounter time more than 30 minutes  Greater than 50% was spent in counseling and coordination of care with the  patient    Orders Placed This Encounter  Procedures   EKG 12-Lead     Signed, Dossie Arbour, M.D., Ph.D. 04/23/2023  [facility] [place], [place] [phone]

## 2023-04-23 ENCOUNTER — Ambulatory Visit: Payer: 59 | Attending: Cardiovascular Disease | Admitting: Cardiovascular Disease

## 2023-04-23 ENCOUNTER — Encounter: Payer: Self-pay | Admitting: Cardiovascular Disease

## 2023-04-23 VITALS — BP 130/72 | HR 83 | Ht 61.0 in | Wt 246.0 lb

## 2023-04-23 DIAGNOSIS — I5032 Chronic diastolic (congestive) heart failure: Secondary | ICD-10-CM

## 2023-04-23 DIAGNOSIS — E782 Mixed hyperlipidemia: Secondary | ICD-10-CM

## 2023-04-23 DIAGNOSIS — E119 Type 2 diabetes mellitus without complications: Secondary | ICD-10-CM

## 2023-04-23 DIAGNOSIS — R0602 Shortness of breath: Secondary | ICD-10-CM | POA: Diagnosis not present

## 2023-04-23 DIAGNOSIS — I2510 Atherosclerotic heart disease of native coronary artery without angina pectoris: Secondary | ICD-10-CM | POA: Diagnosis not present

## 2023-04-23 DIAGNOSIS — G479 Sleep disorder, unspecified: Secondary | ICD-10-CM

## 2023-04-23 DIAGNOSIS — Z7984 Long term (current) use of oral hypoglycemic drugs: Secondary | ICD-10-CM

## 2023-04-23 DIAGNOSIS — R002 Palpitations: Secondary | ICD-10-CM

## 2023-04-23 DIAGNOSIS — R609 Edema, unspecified: Secondary | ICD-10-CM

## 2023-04-23 MED ORDER — EZETIMIBE 10 MG PO TABS: 10.0000 mg | ORAL_TABLET | Freq: Every day | ORAL | 6 refills | Status: DC

## 2023-04-23 NOTE — Patient Instructions (Addendum)
Medication Instructions:  Please start zetia after repatha is complete   If you need a refill on your cardiac medications before your next appointment, please call your pharmacy.   Lab work: No new labs needed  Testing/Procedures: No new testing needed  Follow-Up: At [facility], you and your health needs are our priority.  As part of our continuing mission to provide you with exceptional heart care, we have created designated Provider Care Teams.  These Care Teams include your primary Cardiologist (physician) and Advanced Practice Providers (APPs -  Physician Assistants and Nurse Practitioners) who all work together to provide you with the care you need, when you need it.  You will need a follow up appointment in 12 months  Providers on your designated Care Team:   Nicolasa Ducking, NP Eula Listen, PA-C Cadence Fransico [place], [place]  COVID-19 Vaccine Information can be found at: [email or web address] For questions related to vaccine distribution or appointments, please email [email or web address] or call [phone].

## 2023-05-08 ENCOUNTER — Other Ambulatory Visit: Payer: Self-pay

## 2023-05-08 MED ORDER — ISOSORBIDE MONONITRATE ER 30 MG PO TB24: ORAL_TABLET | ORAL | 2 refills | Status: DC

## 2023-05-08 MED ORDER — NEBIVOLOL HCL 10 MG PO TABS: ORAL_TABLET | ORAL | 2 refills | Status: DC

## 2023-05-08 MED ORDER — EZETIMIBE 10 MG PO TABS: 10.0000 mg | ORAL_TABLET | Freq: Every day | ORAL | 2 refills | Status: DC

## 2023-06-11 ENCOUNTER — Encounter: Payer: Self-pay | Admitting: Cardiovascular Disease

## 2023-07-04 ENCOUNTER — Encounter: Payer: Self-pay | Admitting: Medical

## 2023-07-08 ENCOUNTER — Encounter: Payer: Self-pay | Admitting: Medical

## 2023-11-27 ENCOUNTER — Ambulatory Visit: Admission: RE | Admit: 2023-11-27 | Payer: 59 | Source: Ambulatory Visit

## 2023-11-27 ENCOUNTER — Other Ambulatory Visit: Payer: Self-pay | Admitting: Orthopedic Surgery

## 2023-11-27 DIAGNOSIS — M5412 Radiculopathy, cervical region: Secondary | ICD-10-CM

## 2024-01-22 ENCOUNTER — Telehealth: Payer: Self-pay | Admitting: Cardiovascular Disease

## 2024-01-22 MED ORDER — RANOLAZINE ER 500 MG PO TB12: 1000.0000 mg | ORAL_TABLET | Freq: Two times a day (BID) | ORAL | 1 refills | Status: DC

## 2024-01-22 NOTE — Telephone Encounter (Signed)
*  STAT* If patient is at the pharmacy, call can be transferred to refill team.   1. Which medications need to be refilled? (please list name of each medication and dose if known)  ranolazine (RANEXA) 500 MG 12 hr tablet    2. Would you like to learn more about the convenience, safety, & potential cost savings by using the [facility]?     3. Are you open to using the [facility] (Type [facility].  ).   4. Which pharmacy/location (including street and city if local pharmacy) is medication to be sent to?  [facility] - [place], [place] - [address]     5. Do they need a 30 day or 90 day supply? 90 day

## 2024-01-22 NOTE — Telephone Encounter (Signed)
Pt's medication was sent to pt's pharmacy as requested. Confirmation received.

## 2024-04-19 ENCOUNTER — Other Ambulatory Visit: Payer: Self-pay | Admitting: Cardiovascular Disease

## 2024-04-20 NOTE — Telephone Encounter (Signed)
 Good Morning,  Could someone schedule this patient a 12 month follow up visit? The patient was last seen by Dr. Gollan on 04-23-2023. Thank you so much.

## 2024-06-21 NOTE — Progress Notes (Unsigned)
 Cardiology Office Note  Date:  06/22/2024   ID:  Emma Bauer, DOB 09/21/1966, MRN [id number]  PCP:  Emma Lynwood NOVAK, MD   Chief Complaint  Patient presents with   12 month follow up     Doing well.     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,  remote DVT of the left leg after a long car trip,  Chronic chest pain dating back to 2016 cardiac catheterization in 2007 and 2016 for chest pain that showed noncritical CAD,  who presents for routine followup of her chest pain And leg swelling  Last seen by myself in clinic May 05, 2024 Recent stress, Brother died unclear causes  Reports palpitations/tachycardia relatively well-controlled Denies significant chest pain on Ranexa  1000 twice daily  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, work  Prior imaging reviewed Calcium  score 0 Unable to perform cardiac CTA secondary to heart rate  Echo March 26, 2023, normal EF  Labs reviewed: Sodium 141, potassium 3.4 A1C 5.7, CR 0.96  Zio monitor February 2024 Normal sinus rhythm Rare short episodes tachycardia, triggered events associated with normal rhythm, rare PVC  EKG personally reviewed by myself on todays visit EKG Interpretation Date/Time:  Monday June 22 2024 08:19:52 EDT Ventricular Rate:  77 PR Interval:  158 QRS Duration:  72 QT Interval:  396 QTC Calculation: 448 R Axis:   -23  Text Interpretation: Normal sinus rhythm Low voltage QRS When compared with ECG of 19-Dec-2021 11:40, No significant change was found Confirmed by Perla Lye [phone]) on 06/22/2024 8:31:30 AM   Other past medical hx  cardiac catheterization 2016 showing no significant coronary artery disease Started on ranexa  and low-dose isosorbide  (1/2 dose) No further significant chest pain   PMH:   has a past medical history of Asthma,  Coronary artery disease, non-occlusive, DM (diabetes mellitus) (HCC), DVT (deep venous thrombosis) (HCC), HLD (hyperlipidemia), HTN (hypertension), Hypertriglyceridemia, Hypothyroidism, Obesity, and Pseudotumor cerebri.  PSH:    Past Surgical History:  Procedure Laterality Date   ablasion     CARDIAC CATHETERIZATION  01/27/2015   [facility]   CESAREAN SECTION     x3    GALLBLADDER SURGERY     lumbar paritineal shunt      Current Outpatient Medications  Medication Sig Dispense Refill   albuterol  (PROVENTIL ) (2.5 MG/3ML) 0.083% nebulizer solution Inhale into the lungs.     albuterol  (VENTOLIN  HFA) 108 (90 Base) MCG/ACT inhaler Inhale into the lungs.     cetirizine (ZYRTEC) 10 MG tablet Take 10 mg by mouth daily.     Cholecalciferol (VITAMIN D3) 125 MCG (5000 UT) TABS Take 1 tablet by mouth daily.     furosemide  (LASIX ) 20 MG tablet Take 1 tablet (20 mg total) by mouth 2 (two) times daily as needed. 60 tablet 3   lansoprazole (PREVACID) 30 MG capsule Take 30 mg by mouth daily.   5   magnesium gluconate (MAGONATE) 500 MG tablet Take 500 mg by mouth 2 (two) times daily.     montelukast (SINGULAIR) 10 MG tablet Take 10 mg by mouth daily.      MOUNJARO 5 MG/0.5ML Pen Inject 5 mg into the skin once a week.     naproxen (NAPROSYN) 500 MG tablet Take 1 tablet by mouth 2 (two) times daily.     nitroGLYCERIN  (NITROSTAT ) 0.4 MG SL  tablet Place 1 tablet (0.4 mg total) under the tongue every 5 (five) minutes as needed for chest pain. 25 tablet 6   pregabalin (LYRICA) 75 MG capsule Take 75 mg by mouth 2 (two) times daily.      thyroid (ARMOUR) 180 MG tablet Take 180 mg by mouth daily.      Vilazodone HCl 20 MG TABS Take 20 mg by mouth daily.     atorvastatin  (LIPITOR) 10 MG tablet Take 1 tablet (10 mg total) by mouth daily. 90 tablet 3   budesonide-formoterol (SYMBICORT) 160-4.5 MCG/ACT inhaler  (Patient not taking: Reported on 06/22/2024)     citalopram (CELEXA) 20 MG tablet Take 20 mg by mouth daily. Take  one and half tablet daily. (Patient not taking: Reported on 06/22/2024)     ezetimibe  (ZETIA ) 10 MG tablet Take 1 tablet (10 mg total) by mouth daily. 90 tablet 3   fenofibrate  160 MG tablet Take 1 tablet (160 mg total) by mouth daily. 90 tablet 3   isosorbide  mononitrate (IMDUR ) 30 MG 24 hr tablet TAKE 1 TABLET(30 MG) BY MOUTH DAILY 90 tablet 3   metFORMIN (GLUCOPHAGE) 500 MG tablet Take 1,000 mg by mouth daily. (Patient not taking: Reported on 06/22/2024)     nebivolol  (BYSTOLIC ) 10 MG tablet TAKE 1 TABLET(10 MG) BY MOUTH DAILY 90 tablet 3   ranolazine  (RANEXA ) 500 MG 12 hr tablet Take 2 tablets (1,000 mg total) by mouth 2 (two) times daily. 360 tablet 3   traMADol (ULTRAM) 50 MG tablet Take 50 mg by mouth every 6 (six) hours as needed. (Patient not taking: Reported on 06/22/2024)     zolmitriptan (ZOMIG) 5 MG tablet Take 5 mg by mouth as needed (headache).  (Patient not taking: Reported on 06/22/2024)     No current facility-administered medications for this visit.     Allergies:   Penicillins   Social History:  The patient  reports that she has never smoked. She has never used smokeless tobacco. She reports that she does not drink alcohol and does not use drugs.   Family History:   family history includes CAD in her father, maternal aunt, maternal grandmother, and paternal aunt; Heart attack in her father; Heart failure in her brother.    Review of Systems: Review of Systems  Constitutional: Negative.   HENT: Negative.    Respiratory: Negative.    Cardiovascular: Negative.   Gastrointestinal: Negative.   Musculoskeletal: Negative.   Neurological: Negative.   Psychiatric/Behavioral: Negative.    All other systems reviewed and are negative.  PHYSICAL EXAM: VS:  BP 120/60 (BP Location: Left Arm, Patient Position: Sitting, Cuff Size: Normal)   Pulse 77   Ht 5' 1.6 (1.565 m)   Wt 205 lb 8 oz (93.2 kg)   SpO2 97%   BMI 38.08 kg/m  , BMI Body mass index is 38.08  kg/m. Constitutional:  oriented to person, place, and time. No distress.  HENT:  Head: Grossly normal Eyes:  no discharge. No scleral icterus.  Neck: No JVD, no carotid bruits  Cardiovascular: Regular rate and rhythm, no murmurs appreciated Pulmonary/Chest: Clear to auscultation bilaterally, no wheezes or rales Abdominal: Soft.  no distension.  no tenderness.  Musculoskeletal: Normal range of motion Neurological:  normal muscle tone. Coordination normal. No atrophy Skin: Skin warm and dry Psychiatric: normal affect, pleasant  Recent Labs: No results found for requested labs within last 365 days.    Lipid Panel Lab Results  Component Value Date   CHOL 155  01/06/2015   HDL 28 (L) 01/06/2015   LDLCALC 51 01/06/2015   TRIG 379 (H) 01/06/2015      Wt Readings from Last 3 Encounters:  06/22/24 205 lb 8 oz (93.2 kg)  04/23/23 246 lb (111.6 kg)  01/01/23 252 lb (114.3 kg)     ASSESSMENT AND PLAN:  Coronary artery disease, non-occlusive - Denies chest pain concerning for angina Tolerating Imdur  30 daily and Ranexa  1000 twice daily with her beta-blocker  Small vessel disease on prior cardiac catheterization 2017, No large vessel disease  Tachycardia - Symptoms stable on beta-blocker, Ranexa  Zio monitor reviewed Triggered events associated with normal sinus rhythm, PVCs  HLD (hyperlipidemia)  Recommend she continue Zetia  10 mg daily Lipitor 10 Fenofibrate  daily recommended calorie/carbs restriction  Controlled type 2 diabetes mellitus without complication, without long-term current use of insulin (HCC) A1c  5.7, trending down  Morbid obesity due to excess calories (HCC) Recommend walking program,  On GLP-1, carb restriction  SOB Recommend walking program for conditioning, weight loss, low carbohydrates   Orders Placed This Encounter  Procedures   EKG 12-Lead     Signed, Velinda Lunger, M.D., Ph.D. 06/22/2024  [facility] [place],  [place] [phone]

## 2024-06-22 ENCOUNTER — Encounter: Payer: Self-pay | Admitting: Cardiovascular Disease

## 2024-06-22 ENCOUNTER — Ambulatory Visit: Attending: Cardiovascular Disease | Admitting: Cardiovascular Disease

## 2024-06-22 VITALS — BP 120/60 | HR 77 | Ht 61.6 in | Wt 205.5 lb

## 2024-06-22 DIAGNOSIS — R072 Precordial pain: Secondary | ICD-10-CM

## 2024-06-22 DIAGNOSIS — R609 Edema, unspecified: Secondary | ICD-10-CM

## 2024-06-22 DIAGNOSIS — G479 Sleep disorder, unspecified: Secondary | ICD-10-CM

## 2024-06-22 DIAGNOSIS — E782 Mixed hyperlipidemia: Secondary | ICD-10-CM

## 2024-06-22 DIAGNOSIS — R0602 Shortness of breath: Secondary | ICD-10-CM | POA: Diagnosis not present

## 2024-06-22 DIAGNOSIS — I2510 Atherosclerotic heart disease of native coronary artery without angina pectoris: Secondary | ICD-10-CM

## 2024-06-22 DIAGNOSIS — I5032 Chronic diastolic (congestive) heart failure: Secondary | ICD-10-CM | POA: Diagnosis not present

## 2024-06-22 DIAGNOSIS — R002 Palpitations: Secondary | ICD-10-CM

## 2024-06-22 DIAGNOSIS — E119 Type 2 diabetes mellitus without complications: Secondary | ICD-10-CM

## 2024-06-22 MED ORDER — NEBIVOLOL HCL 10 MG PO TABS: ORAL_TABLET | ORAL | 3 refills | Status: AC

## 2024-06-22 MED ORDER — ISOSORBIDE MONONITRATE ER 30 MG PO TB24: ORAL_TABLET | ORAL | 3 refills | Status: AC

## 2024-06-22 MED ORDER — RANOLAZINE ER 500 MG PO TB12: 1000.0000 mg | ORAL_TABLET | Freq: Two times a day (BID) | ORAL | 3 refills | Status: AC

## 2024-06-22 MED ORDER — ATORVASTATIN CALCIUM 10 MG PO TABS: 10.0000 mg | ORAL_TABLET | Freq: Every day | ORAL | 3 refills | Status: AC

## 2024-06-22 MED ORDER — FENOFIBRATE 160 MG PO TABS: 160.0000 mg | ORAL_TABLET | Freq: Every day | ORAL | 3 refills | Status: AC

## 2024-06-22 MED ORDER — EZETIMIBE 10 MG PO TABS: 10.0000 mg | ORAL_TABLET | Freq: Every day | ORAL | 3 refills | Status: AC

## 2024-06-22 NOTE — Patient Instructions (Signed)
